# Patient Record
Sex: Male | Born: 1965
Health system: Southern US, Community
[De-identification: ages and names within clinical notes are randomized; demographics above are authoritative.]

## PROBLEM LIST (undated history)

## (undated) DIAGNOSIS — Z87820 Personal history of traumatic brain injury: Secondary | ICD-10-CM

## (undated) DIAGNOSIS — R569 Unspecified convulsions: Secondary | ICD-10-CM

## (undated) DIAGNOSIS — M1990 Unspecified osteoarthritis, unspecified site: Secondary | ICD-10-CM

## (undated) DIAGNOSIS — I10 Essential (primary) hypertension: Secondary | ICD-10-CM

## (undated) DIAGNOSIS — H3552 Pigmentary retinal dystrophy: Secondary | ICD-10-CM

## (undated) DIAGNOSIS — F419 Anxiety disorder, unspecified: Secondary | ICD-10-CM

## (undated) DIAGNOSIS — H269 Unspecified cataract: Secondary | ICD-10-CM

## (undated) DIAGNOSIS — K219 Gastro-esophageal reflux disease without esophagitis: Secondary | ICD-10-CM

## (undated) DIAGNOSIS — Z973 Presence of spectacles and contact lenses: Secondary | ICD-10-CM

## (undated) DIAGNOSIS — J45909 Unspecified asthma, uncomplicated: Secondary | ICD-10-CM

## (undated) DIAGNOSIS — Z87898 Personal history of other specified conditions: Secondary | ICD-10-CM

## (undated) DIAGNOSIS — N4340 Spermatocele of epididymis, unspecified: Secondary | ICD-10-CM

## (undated) HISTORY — DX: Unspecified asthma, uncomplicated: J45.909

## (undated) HISTORY — DX: Unspecified cataract: H26.9

## (undated) HISTORY — DX: Gastro-esophageal reflux disease without esophagitis: K21.9

## (undated) HISTORY — PX: COLONOSCOPY: SHX174

## (undated) HISTORY — PX: INGUINAL HERNIA REPAIR: SUR1180

## (undated) HISTORY — DX: Pigmentary retinal dystrophy: H35.52

---

## 2000-11-20 ENCOUNTER — Emergency Department (HOSPITAL_COMMUNITY): Admission: EM | Admit: 2000-11-20 | Discharge: 2000-11-20 | Payer: Self-pay | Admitting: *Deleted

## 2001-10-15 ENCOUNTER — Encounter: Payer: Self-pay | Admitting: Emergency Medicine

## 2001-10-15 ENCOUNTER — Emergency Department (HOSPITAL_COMMUNITY): Admission: EM | Admit: 2001-10-15 | Discharge: 2001-10-15 | Payer: Self-pay | Admitting: Emergency Medicine

## 2003-10-27 ENCOUNTER — Emergency Department (HOSPITAL_COMMUNITY): Admission: EM | Admit: 2003-10-27 | Discharge: 2003-10-27 | Payer: Self-pay | Admitting: Emergency Medicine

## 2010-12-02 ENCOUNTER — Emergency Department (HOSPITAL_COMMUNITY)
Admission: EM | Admit: 2010-12-02 | Discharge: 2010-12-02 | Disposition: A | Discharge status: Home / Self Care | Payer: Self-pay | Attending: Emergency Medicine | Admitting: Emergency Medicine

## 2010-12-02 ENCOUNTER — Emergency Department (HOSPITAL_COMMUNITY): Payer: Self-pay

## 2010-12-02 DIAGNOSIS — R002 Palpitations: Secondary | ICD-10-CM | POA: Insufficient documentation

## 2010-12-02 DIAGNOSIS — IMO0001 Reserved for inherently not codable concepts without codable children: Secondary | ICD-10-CM | POA: Insufficient documentation

## 2010-12-02 DIAGNOSIS — R079 Chest pain, unspecified: Secondary | ICD-10-CM | POA: Insufficient documentation

## 2010-12-02 DIAGNOSIS — R059 Cough, unspecified: Secondary | ICD-10-CM | POA: Insufficient documentation

## 2010-12-02 DIAGNOSIS — R000 Tachycardia, unspecified: Secondary | ICD-10-CM | POA: Insufficient documentation

## 2010-12-02 DIAGNOSIS — R110 Nausea: Secondary | ICD-10-CM | POA: Insufficient documentation

## 2010-12-02 DIAGNOSIS — R51 Headache: Secondary | ICD-10-CM | POA: Insufficient documentation

## 2010-12-02 DIAGNOSIS — R05 Cough: Secondary | ICD-10-CM | POA: Insufficient documentation

## 2010-12-02 DIAGNOSIS — R0602 Shortness of breath: Secondary | ICD-10-CM | POA: Insufficient documentation

## 2010-12-02 DIAGNOSIS — R109 Unspecified abdominal pain: Secondary | ICD-10-CM | POA: Insufficient documentation

## 2010-12-02 LAB — CBC
HCT: 41.3 % (ref 39.0–52.0)
Hemoglobin: 13.5 g/dL (ref 13.0–17.0)
MCH: 23.4 pg — ABNORMAL LOW (ref 26.0–34.0)
MCHC: 32.7 g/dL (ref 30.0–36.0)
MCV: 71.7 fL — ABNORMAL LOW (ref 78.0–100.0)
Platelets: 155 10*3/uL (ref 150–400)
RBC: 5.76 MIL/uL (ref 4.22–5.81)
RDW: 16.6 % — ABNORMAL HIGH (ref 11.5–15.5)
WBC: 8 10*3/uL (ref 4.0–10.5)

## 2010-12-02 LAB — URINALYSIS, ROUTINE W REFLEX MICROSCOPIC
Bilirubin Urine: NEGATIVE
Glucose, UA: NEGATIVE mg/dL
Hgb urine dipstick: NEGATIVE
Ketones, ur: NEGATIVE mg/dL
Nitrite: NEGATIVE
Protein, ur: NEGATIVE mg/dL
Specific Gravity, Urine: 1.02 (ref 1.005–1.030)
Urobilinogen, UA: 0.2 mg/dL (ref 0.0–1.0)
pH: 5.5 (ref 5.0–8.0)

## 2010-12-02 LAB — COMPREHENSIVE METABOLIC PANEL
ALT: 22 U/L (ref 0–53)
AST: 29 U/L (ref 0–37)
Albumin: 3.7 g/dL (ref 3.5–5.2)
Alkaline Phosphatase: 104 U/L (ref 39–117)
BUN: 11 mg/dL (ref 6–23)
CO2: 25 mEq/L (ref 19–32)
Calcium: 9.3 mg/dL (ref 8.4–10.5)
Chloride: 99 mEq/L (ref 96–112)
Creatinine, Ser: 0.88 mg/dL (ref 0.4–1.5)
GFR calc Af Amer: >60 mL/min (ref >60)
GFR calc non Af Amer: >60 mL/min (ref >60)
Glucose, Bld: 95 mg/dL (ref 70–99)
Potassium: 3.9 mEq/L (ref 3.5–5.1)
Sodium: 134 mEq/L — ABNORMAL LOW (ref 135–145)
Total Bilirubin: 0.6 mg/dL (ref 0.3–1.2)
Total Protein: 7.2 g/dL (ref 6.0–8.3)

## 2010-12-02 LAB — DIFFERENTIAL
Basophils Absolute: 0 10*3/uL (ref 0.0–0.1)
Basophils Relative: 0 % (ref 0–1)
Eosinophils Absolute: 0.2 10*3/uL (ref 0.0–0.7)
Eosinophils Relative: 3 % (ref 0–5)
Lymphocytes Relative: 32 % (ref 12–46)
Lymphs Abs: 2.5 10*3/uL (ref 0.7–4.0)
Monocytes Absolute: 1.1 10*3/uL — ABNORMAL HIGH (ref 0.1–1.0)
Monocytes Relative: 14 % — ABNORMAL HIGH (ref 3–12)
Neutro Abs: 4.1 10*3/uL (ref 1.7–7.7)
Neutrophils Relative %: 52 % (ref 43–77)

## 2011-07-27 ENCOUNTER — Emergency Department (HOSPITAL_COMMUNITY): Payer: Self-pay

## 2011-07-27 ENCOUNTER — Encounter (HOSPITAL_COMMUNITY): Payer: Self-pay | Admitting: Radiology

## 2011-07-27 ENCOUNTER — Emergency Department (HOSPITAL_COMMUNITY)
Admission: EM | Admit: 2011-07-27 | Discharge: 2011-07-27 | Disposition: A | Discharge status: Home / Self Care | Payer: Self-pay | Attending: Emergency Medicine | Admitting: Emergency Medicine

## 2011-07-27 DIAGNOSIS — I10 Essential (primary) hypertension: Secondary | ICD-10-CM | POA: Insufficient documentation

## 2011-07-27 DIAGNOSIS — R51 Headache: Secondary | ICD-10-CM | POA: Insufficient documentation

## 2011-07-27 DIAGNOSIS — J3489 Other specified disorders of nose and nasal sinuses: Secondary | ICD-10-CM | POA: Insufficient documentation

## 2011-07-27 HISTORY — DX: Unspecified convulsions: R56.9

## 2011-07-27 MED ORDER — HYDROCODONE-ACETAMINOPHEN 5-500 MG PO TABS: 1.0000 | ORAL_TABLET | Freq: Four times a day (QID) | ORAL | Status: AC | PRN

## 2011-07-27 MED ORDER — AMLODIPINE BESYLATE 5 MG PO TABS: 5.0000 mg | ORAL_TABLET | Freq: Every day | ORAL | Status: DC

## 2011-07-27 MED ORDER — HYDROCODONE-ACETAMINOPHEN 5-325 MG PO TABS
2.0000 | ORAL_TABLET | Freq: Once | ORAL | Status: AC
  Administered 2011-07-27: 2 via ORAL
  Filled 2011-07-27: qty 2

## 2011-07-27 MED ORDER — CETIRIZINE-PSEUDOEPHEDRINE ER 5-120 MG PO TB12: 1.0000 | ORAL_TABLET | Freq: Two times a day (BID) | ORAL | Status: AC | PRN

## 2011-07-27 MED ORDER — OXYCODONE-ACETAMINOPHEN 5-325 MG PO TABS
1.0000 | ORAL_TABLET | Freq: Once | ORAL | Status: AC
  Administered 2011-07-27: 1 via ORAL
  Filled 2011-07-27: qty 1

## 2011-07-27 NOTE — ED Provider Notes (Addendum)
History     CSN: [id number]  Arrival date & time 07/27/11  1258   First MD Initiated Contact with Patient 07/27/11 1304      No chief complaint on file. chief complaint: headache  (Consider location/radiation/quality/duration/timing/severity/associated sxs/prior treatment) The history is provided by the patient.  pt c/o frontal headache for past 5-6 days. Gradual onset, constant. Dull. Non radiating. Slow worse. No acute or abrupt change since onset. Mild at onset. Notes similar headaches as child/teen but states this is longer in duration. No recent head injury, trauma, or fall. No neck pain or stiffness. No nv. No numbness/weakness. No eye pain or change in vision. No change in speech. No problems w balance or coordination. No change by time of day, activity, or position. Pt denies exacerbating or alleviating factors. States has had recent sinus and nasal congestion, rhinorrhea. Frontal sinus area pain as well. No sore throat, cough, fever, or other uri c/o.   No past medical history on file.  No past surgical history on file.  No family history on file.  History  Substance Use Topics  . Smoking status: Not on file  . Smokeless tobacco: Not on file  . Alcohol Use: Not on file      Review of Systems  Constitutional: Negative for fever and chills.  HENT: Negative for neck pain and neck stiffness.   Eyes: Negative for pain, redness and visual disturbance.  Respiratory: Negative for shortness of breath.   Cardiovascular: Negative for chest pain, palpitations and leg swelling.  Gastrointestinal: Negative for abdominal pain.  Genitourinary: Negative for flank pain.  Musculoskeletal: Negative for back pain.  Skin: Negative for rash.  Neurological: Positive for headaches. Negative for syncope, weakness and numbness.  Hematological: Does not bruise/bleed easily.  Psychiatric/Behavioral: Negative for confusion.    Allergies  Aspirin  Home Medications   Current Outpatient Rx   Name Route Sig Dispense Refill  . FEXOFENADINE HCL 180 MG PO TABS Oral Take 180 mg by mouth once.    Marland Kitchen ONDANSETRON HCL 4 MG PO TABS Oral Take 4 mg by mouth every 8 (eight) hours as needed. For nausea.    Marland Kitchen VICKS SINEX 12 HOUR NA Nasal Place 1 spray into the nose every 12 (twelve) hours. For nasal decongestant    . TETRAHYDROZOLINE HCL 0.05 % OP SOLN Both Eyes Place 1 drop into both eyes as needed.      BP 158/112  Pulse 72  Temp(Src) 98.3 F (36.8 C) (Oral)  Resp 20  SpO2 98%  Physical Exam  Nursing note and vitals reviewed. Constitutional: He is oriented to person, place, and time. He appears well-developed and well-nourished. No distress.  HENT:  Head: Atraumatic.  Mouth/Throat: Oropharynx is clear and moist.       Mild frontal sinus tenderness. No oral trauma.   Eyes: Conjunctivae and EOM are normal. Pupils are equal, round, and reactive to light.  Neck: Normal range of motion. Neck supple. No tracheal deviation present.       No stiffness or rigidity  Cardiovascular: Normal rate, regular rhythm, normal heart sounds and intact distal pulses.  Exam reveals no gallop and no friction rub.   No murmur heard. Pulmonary/Chest: Effort normal and breath sounds normal. No accessory muscle usage. No respiratory distress. He has no rales.  Abdominal: Soft. Bowel sounds are normal. He exhibits no distension and no mass. There is no tenderness. There is no rebound and no guarding.  Musculoskeletal: Normal range of motion. He exhibits no edema  and no tenderness.  Neurological: He is alert and oriented to person, place, and time.       No facial droop. Motor intact bil. No pronator drift. Steady gait.   Skin: Skin is warm and dry.  Psychiatric: He has a normal mood and affect.    ED Course  Procedures (including critical care time)   Ct Head Wo Contrast  07/27/2011  *RADIOLOGY REPORT*  Clinical Data: Headache for 5 days.  Possible seizure.  CT HEAD WITHOUT CONTRAST  Technique:   Contiguous axial images were obtained from the base of the skull through the vertex without contrast.  Comparison: None.  Findings: The brain appears normal without evidence of acute infarction, hemorrhage, mass lesion, mass effect, midline shift or abnormal extra-axial fluid collection.  No hydrocephalus or pneumocephalus.  Mucous retention cysts or polyps are seen in the right maxillary sinus and left sphenoid sinus.  IMPRESSION: No acute finding.  Original Report Authenticated By: Bernadene Bell. D'ALESSIO, M.D.      MDM  vicodin po, ct.  Ct neg acute.   recheck pt comfortable. Neuro exam unremarkable. No neck stiffness/rigidity.   Pt notes hx htn, noted during prior ed visit to be hypertensive, will give rx htn, also will give rx re sinus symptoms.     Discussed importance of close pcp f/u .       Suzi Roots, MD 07/27/11 1600  Suzi Roots, MD 07/27/11 [phone]

## 2011-07-27 NOTE — ED Notes (Signed)
Patient changed self into gown. Darren Larsen is backwards but patient prefers this. Patient is connected to continues BP, Pulse Ox and telemetry.

## 2011-07-27 NOTE — ED Notes (Signed)
Patient transported to X-ray 

## 2011-07-27 NOTE — ED Notes (Addendum)
Pt states that he has had bad headaches x5 days and that when he woke up this morning the pain was much worse.  He states that he has a hx of seizures 20 years ago.  He is not on any meds for seizures.  He has also been vomiting for the 2 days.  Last emesis was around 12:45.  Pain is 10/10.

## 2012-04-05 ENCOUNTER — Encounter (HOSPITAL_COMMUNITY): Payer: Self-pay | Admitting: Emergency Medicine

## 2012-04-05 ENCOUNTER — Emergency Department (HOSPITAL_COMMUNITY)
Admission: EM | Admit: 2012-04-05 | Discharge: 2012-04-05 | Disposition: A | Discharge status: Home / Self Care | Payer: Self-pay | Attending: Emergency Medicine | Admitting: Emergency Medicine

## 2012-04-05 ENCOUNTER — Emergency Department (HOSPITAL_COMMUNITY): Payer: Self-pay

## 2012-04-05 DIAGNOSIS — M25562 Pain in left knee: Secondary | ICD-10-CM

## 2012-04-05 DIAGNOSIS — Z886 Allergy status to analgesic agent status: Secondary | ICD-10-CM | POA: Insufficient documentation

## 2012-04-05 DIAGNOSIS — M25569 Pain in unspecified knee: Secondary | ICD-10-CM | POA: Insufficient documentation

## 2012-04-05 DIAGNOSIS — X500XXA Overexertion from strenuous movement or load, initial encounter: Secondary | ICD-10-CM | POA: Insufficient documentation

## 2012-04-05 DIAGNOSIS — F17200 Nicotine dependence, unspecified, uncomplicated: Secondary | ICD-10-CM | POA: Insufficient documentation

## 2012-04-05 HISTORY — DX: Essential (primary) hypertension: I10

## 2012-04-05 MED ORDER — OXYCODONE-ACETAMINOPHEN 5-325 MG PO TABS
2.0000 | ORAL_TABLET | Freq: Once | ORAL | Status: AC
  Administered 2012-04-05: 2 via ORAL
  Filled 2012-04-05: qty 2

## 2012-04-05 MED ORDER — CYCLOBENZAPRINE HCL 10 MG PO TABS: 10.0000 mg | ORAL_TABLET | Freq: Two times a day (BID) | ORAL | Status: DC | PRN

## 2012-04-05 MED ORDER — OXYCODONE-ACETAMINOPHEN 5-325 MG PO TABS: 1.0000 | ORAL_TABLET | Freq: Four times a day (QID) | ORAL | Status: DC | PRN

## 2012-04-05 MED ORDER — AMLODIPINE BESYLATE 10 MG PO TABS: 5.0000 mg | ORAL_TABLET | Freq: Every day | ORAL | Status: DC

## 2012-04-05 MED ORDER — IBUPROFEN 800 MG PO TABS: 800.0000 mg | ORAL_TABLET | Freq: Three times a day (TID) | ORAL | Status: DC

## 2012-04-05 NOTE — ED Provider Notes (Signed)
History     CSN: [id number]  Arrival date & time 04/05/12  1308   First MD Initiated Contact with Patient 04/05/12 1324      Chief Complaint  Patient presents with  . Knee Pain    (Consider location/radiation/quality/duration/timing/severity/associated sxs/prior treatment) HPI Comments: Darren Larsen 46 y.o. male   The chief complaint is: Patient presents with:   Knee Pain   The patient has medical history significant for:   Past Medical History:   Seizures                                                     Hypertension                                                Patient presents with left knee pain x 1 month. Patient states that be believes that he may have hurt it while kickboxing. Patient states that the knee does not hurt when extended. He tried a knee wrap but thinks it made the pain worse. Of note, patient does hard manual labor, building walls from heavy blocks. Denies fever or chills. Denies NVD or abdominal pain. Reports some gait change with a mild limp.     The history is provided by the patient. No language interpreter was used.    Past Medical History  Diagnosis Date  . Seizures   . Hypertension     Past Surgical History  Procedure Date  . Hernia repair     History reviewed. No pertinent family history.  History  Substance Use Topics  . Smoking status: Current Every Day Smoker -- 0.5 packs/day    Types: Cigarettes  . Smokeless tobacco: Not on file  . Alcohol Use: Yes     Occ      Review of Systems  Constitutional: Negative for fever and chills.  Gastrointestinal: Negative for nausea, vomiting, abdominal pain and diarrhea.  Musculoskeletal: Positive for arthralgias and gait problem.  All other systems reviewed and are negative.    Allergies  Aspirin  Home Medications   Current Outpatient Rx  Name Route Sig Dispense Refill  . TETRAHYDROZOLINE HCL 0.05 % OP SOLN Both Eyes Place 1 drop into both eyes as needed. For redness    .  AMLODIPINE BESYLATE 5 MG PO TABS Oral Take 1 tablet (5 mg total) by mouth daily. 30 tablet 0  . CETIRIZINE-PSEUDOEPHEDRINE ER 5-120 MG PO TB12 Oral Take 1 tablet by mouth 2 (two) times daily as needed for allergies. 14 tablet 0  . ONDANSETRON HCL 4 MG PO TABS Oral Take 4 mg by mouth every 8 (eight) hours as needed. For nausea.    Marland Kitchen VICKS SINEX 12 HOUR NA Nasal Place 1 spray into the nose every 12 (twelve) hours. For nasal decongestant      BP 120/94  Pulse 100  Temp 99.4 F (37.4 C) (Oral)  Resp 20  SpO2 98%  Physical Exam  Nursing note and vitals reviewed. Constitutional: He appears well-developed and well-nourished.  HENT:  Head: Normocephalic and atraumatic.  Mouth/Throat: Oropharynx is clear and moist.  Eyes: Conjunctivae normal and EOM are normal.  Neck: Normal range of motion. Neck supple.  Cardiovascular: Regular rhythm and  normal heart sounds.   Pulmonary/Chest: Effort normal and breath sounds normal.  Abdominal: Soft. Bowel sounds are normal. There is no tenderness.  Musculoskeletal: Normal range of motion. He exhibits edema and tenderness.       Left knee is slightly larger than right. Patient has tenderness with flexion. No erythema or increased warmth appreciated.  Neurological: He is alert.  Skin: Skin is warm and dry.    ED Course  Procedures (including critical care time)  Labs Reviewed - No data to display No results found.   1. Knee pain, left       MDM  Patient presented with left knee pain X 1 month. Patient given pain medication with improvement. Patient discharged on pain medication and antiinflammatory. Patient also given crutches and knee sleeve, which he states is better than the one he has at home. Instructed to follow-up with ortho on call in a few days if symptoms do not improve. No red flags for septic arthritis. Return precautions given verbally and in discharge summary.        Pixie Casino, PA-C 04/05/12 1530

## 2012-04-05 NOTE — ED Notes (Signed)
Pt states he feels pain and pressure on his left knee pain for about a month. He did not seek care because of cost.

## 2012-04-05 NOTE — ED Provider Notes (Signed)
Medical screening examination/treatment/procedure(s) were performed by non-physician practitioner and as supervising physician I was immediately available for consultation/collaboration.   Lyanne Co, MD 04/05/12 [phone]

## 2012-04-05 NOTE — ED Notes (Signed)
Ortho tech called 

## 2012-10-03 ENCOUNTER — Encounter (HOSPITAL_COMMUNITY): Payer: Self-pay | Admitting: Emergency Medicine

## 2012-10-03 ENCOUNTER — Emergency Department (HOSPITAL_COMMUNITY)
Admission: EM | Admit: 2012-10-03 | Discharge: 2012-10-03 | Disposition: A | Discharge status: Home / Self Care | Payer: Self-pay | Attending: Emergency Medicine | Admitting: Emergency Medicine

## 2012-10-03 DIAGNOSIS — F17200 Nicotine dependence, unspecified, uncomplicated: Secondary | ICD-10-CM | POA: Insufficient documentation

## 2012-10-03 DIAGNOSIS — I10 Essential (primary) hypertension: Secondary | ICD-10-CM | POA: Insufficient documentation

## 2012-10-03 DIAGNOSIS — Z8669 Personal history of other diseases of the nervous system and sense organs: Secondary | ICD-10-CM | POA: Insufficient documentation

## 2012-10-03 DIAGNOSIS — K047 Periapical abscess without sinus: Secondary | ICD-10-CM | POA: Insufficient documentation

## 2012-10-03 DIAGNOSIS — Z79899 Other long term (current) drug therapy: Secondary | ICD-10-CM | POA: Insufficient documentation

## 2012-10-03 DIAGNOSIS — H9209 Otalgia, unspecified ear: Secondary | ICD-10-CM | POA: Insufficient documentation

## 2012-10-03 MED ORDER — OXYCODONE-ACETAMINOPHEN 5-325 MG PO TABS: 1.0000 | ORAL_TABLET | ORAL | Status: DC | PRN

## 2012-10-03 MED ORDER — PENICILLIN V POTASSIUM 500 MG PO TABS: 250.0000 mg | ORAL_TABLET | Freq: Four times a day (QID) | ORAL | Status: AC

## 2012-10-03 MED ORDER — OXYCODONE-ACETAMINOPHEN 5-325 MG PO TABS
1.0000 | ORAL_TABLET | Freq: Once | ORAL | Status: AC
  Administered 2012-10-03: 1 via ORAL
  Filled 2012-10-03: qty 1

## 2012-10-03 NOTE — ED Provider Notes (Signed)
History     CSN: [id number]  Arrival date & time 10/03/12  1355   First MD Initiated Contact with Patient 10/03/12 1421      Chief Complaint  Patient presents with  . Dental Pain  . Otalgia    (Consider location/radiation/quality/duration/timing/severity/associated sxs/prior treatment) HPI  Patient is a 47 yo M presenting with right sided dental abscess x 1 week. Patient "popped" the abscess last night intentionally with unspecified drainage from site. Pain is radiating to right side of face to right ear. 8/10 constant sharp pain. Eating, drinking, and talking aggravating pain. No alleviating factors. Denies fevers, chills, voice changes, trismus, difficulty swallowing, SOB, or CP. Prior history of trauma to the tooth over dental abscess site.   Past Medical History  Diagnosis Date  . Seizures   . Hypertension     Past Surgical History  Procedure Laterality Date  . Hernia repair      No family history on file.  History  Substance Use Topics  . Smoking status: Current Every Day Smoker -- 0.50 packs/day    Types: Cigarettes  . Smokeless tobacco: Not on file  . Alcohol Use: Yes     Comment: Occ      Review of Systems  Constitutional: Negative for fever and chills.  HENT: Positive for ear pain and dental problem. Negative for congestion, sore throat, rhinorrhea, trouble swallowing and neck pain.   Eyes: Negative for visual disturbance.  Respiratory: Negative for cough and shortness of breath.   Cardiovascular: Negative for chest pain.  Gastrointestinal: Negative for abdominal pain.  Neurological: Negative for dizziness, light-headedness and headaches.    Allergies  Aspirin  Home Medications   Current Outpatient Rx  Name  Route  Sig  Dispense  Refill  . amLODipine (NORVASC) 10 MG tablet   Oral   Take 0.5 tablets (5 mg total) by mouth daily.   30 tablet   3   . ibuprofen (ADVIL,MOTRIN) 800 MG tablet   Oral   Take 1 tablet (800 mg total) by mouth 3  (three) times daily.   21 tablet   0   . Oxymetazoline HCl (VICKS SINEX 12 HOUR NA)   Nasal   Place 1 spray into the nose every 12 (twelve) hours. For nasal decongestant         . tetrahydrozoline 0.05 % ophthalmic solution   Both Eyes   Place 1 drop into both eyes as needed. For redness         . EXPIRED: amLODipine (NORVASC) 5 MG tablet   Oral   Take 1 tablet (5 mg total) by mouth daily.   30 tablet   0     BP 134/92  Temp(Src) 98.8 F (37.1 C) (Oral)  Resp 16  SpO2 100%  Physical Exam  Constitutional: He is oriented to person, place, and time. He appears well-developed and well-nourished.  HENT:  Head: Normocephalic and atraumatic.  Right Ear: Tympanic membrane normal.  Left Ear: Tympanic membrane normal.  Mouth/Throat: Uvula is midline and mucous membranes are normal. Abnormal dentition. Dental abscesses and dental caries present. No oropharyngeal exudate, posterior oropharyngeal edema, posterior oropharyngeal erythema or tonsillar abscesses.    Dime sized fluctuant erythematous tender to palpitation abscess over second to last top right molar. No drainage, bleeding, or swelling. Tooth fracture second to last top left molar.  Eyes: Conjunctivae are normal.  Neck: Trachea normal and normal range of motion. Neck supple. No tracheal deviation present.    Cardiovascular: Normal rate, regular  rhythm and normal heart sounds.   Pulmonary/Chest: Effort normal and breath sounds normal.  Neurological: He is alert and oriented to person, place, and time.    ED Course  Procedures (including critical care time)  Labs Reviewed - No data to display No results found.   1. Dental abscess       MDM  Patient is a 47 yo M presenting with weeklong dental pain. Patient has evidence of dental abscess. Patient had no signs of peritonsillar abscess. Patient was referred to dentistry for further treatment for the dental abscess and tooth fracture. Patient was advised he needed  immediate follow up with the dentist to get the abscess taken care of. Patient understood the complications that could arise if waiting too long. Patient was agreeable to the plan to follow up with dentistry. Patient was given pain medication to manage pain (advised not to drive on the medication) and antibiotics for the abscess. Vitals were stable at time of discharge. Return precautions were given the patient. Patient in agreement with plan to follow up and when to return to ED. Patient is stable at time of discharge         Jeannetta Ellis, PA-C 10/03/12 1736

## 2012-10-03 NOTE — ED Notes (Signed)
Pt states he has had an abscessed tooth on rt side for over a week.  Last night he states that he "busted it".  Pain 8/10.  Also c/o ear pain on rt side.

## 2012-10-05 NOTE — ED Provider Notes (Signed)
Medical screening examination/treatment/procedure(s) were performed by non-physician practitioner and as supervising physician I was immediately available for consultation/collaboration.   Laray Anger, DO 10/05/12 1923

## 2014-09-23 ENCOUNTER — Emergency Department (HOSPITAL_COMMUNITY)
Admission: EM | Admit: 2014-09-23 | Discharge: 2014-09-23 | Disposition: A | Discharge status: Home / Self Care | Payer: Self-pay | Attending: Emergency Medicine | Admitting: Emergency Medicine

## 2014-09-23 ENCOUNTER — Encounter (HOSPITAL_COMMUNITY): Payer: Self-pay

## 2014-09-23 DIAGNOSIS — G629 Polyneuropathy, unspecified: Secondary | ICD-10-CM | POA: Insufficient documentation

## 2014-09-23 DIAGNOSIS — Z720 Tobacco use: Secondary | ICD-10-CM | POA: Insufficient documentation

## 2014-09-23 DIAGNOSIS — I10 Essential (primary) hypertension: Secondary | ICD-10-CM | POA: Insufficient documentation

## 2014-09-23 DIAGNOSIS — Z79899 Other long term (current) drug therapy: Secondary | ICD-10-CM | POA: Insufficient documentation

## 2014-09-23 LAB — BASIC METABOLIC PANEL
ANION GAP: 5 (ref 5–15)
BUN: 13 mg/dL (ref 6–23)
CHLORIDE: 107 mmol/L (ref 96–112)
CO2: 28 mmol/L (ref 19–32)
Calcium: 9.6 mg/dL (ref 8.4–10.5)
Creatinine, Ser: 0.93 mg/dL (ref 0.50–1.35)
Glucose, Bld: 91 mg/dL (ref 70–99)
POTASSIUM: 4.2 mmol/L (ref 3.5–5.1)
SODIUM: 140 mmol/L (ref 135–145)

## 2014-09-23 LAB — CBC
HEMATOCRIT: 40.7 % (ref 39.0–52.0)
Hemoglobin: 13.5 g/dL (ref 13.0–17.0)
MCH: 24 pg — ABNORMAL LOW (ref 26.0–34.0)
MCHC: 33.2 g/dL (ref 30.0–36.0)
MCV: 72.3 fL — ABNORMAL LOW (ref 78.0–100.0)
PLATELETS: 207 10*3/uL (ref 150–400)
RBC: 5.63 MIL/uL (ref 4.22–5.81)
RDW: 16.1 % — AB (ref 11.5–15.5)
WBC: 8.6 10*3/uL (ref 4.0–10.5)

## 2014-09-23 MED ORDER — VITAMIN B-1 100 MG PO TABS: 100.0000 mg | ORAL_TABLET | Freq: Every day | ORAL | Status: DC

## 2014-09-23 MED ORDER — AMLODIPINE BESYLATE 10 MG PO TABS: 5.0000 mg | ORAL_TABLET | Freq: Every day | ORAL | Status: DC

## 2014-09-23 MED ORDER — ONE-A-DAY MENS PO TABS: 1.0000 | ORAL_TABLET | Freq: Every day | ORAL | Status: DC

## 2014-09-23 MED ORDER — HYDROCODONE-ACETAMINOPHEN 5-325 MG PO TABS: 1.0000 | ORAL_TABLET | ORAL | Status: DC | PRN

## 2014-09-23 NOTE — Progress Notes (Signed)
  CARE MANAGEMENT ED NOTE 09/23/2014  Patient:  Darren Larsen, Darren Larsen   Account Number:  [id number]  Date Initiated:  09/23/2014  Documentation initiated by:  Livia Snellen  Subjective/Objective Assessment:   patient presents to Ed with increasing bilateral shoulder, bilateral hand, and L leg numbness and L leg "buring" x 1 year     Subjective/Objective Assessment Detail:     Action/Plan:   Action/Plan Detail:   Anticipated DC Date:  09/23/2014     Status Recommendation to Physician:   Result of Recommendation:    Other ED [place]  Other  PCP issues    Choice offered to / List presented to:            Status of service:  Completed, signed off  ED Comments:   ED Comments Detail:  EDCM spoke to patient at bedside. Patient confirms he does not have a pcp or insurance living in [place]. EDCM provide patient with pamphlet to [facility], informed patient of services there and walk in times.  EDCM also provided patient with list of pcps who accept self pay patients, list of discount pharmacies and websites needymeds.org and GoodRX.com for medication assistance, phone number to inquire about the orange card, phone number to inquire about [employer], phone number to inquire about the [place], financial resources in the community such as local churches, [employer], [employer]ries, and dental assistance for uninsured patients. Patient thankful for resources.  No further EDCM needs at this time.

## 2014-09-23 NOTE — ED Notes (Signed)
Pt c/o increasing bilateral shoulder, bilateral hand, and L leg numbness and L leg "buring" x 1 year.  Pain score 8/10.  Pt reports taking OTC medication w/o relief.  Sts numbness is worse in the morning.

## 2014-09-23 NOTE — Discharge Instructions (Signed)
Neuropathic Pain We often think that pain has a physical cause. If we get rid of the cause, the pain should go away. Nerves themselves can also cause pain. It is called neuropathic pain, which means nerve abnormality. It may be difficult for the patients who have it and for the treating caregivers. Pain is usually described as acute (short-lived) or chronic (long-lasting). Acute pain is related to the physical sensations caused by an injury. It can last from a few seconds to many weeks, but it usually goes away when normal healing occurs. Chronic pain lasts beyond the typical healing time. With neuropathic pain, the nerve fibers themselves may be damaged or injured. They then send incorrect signals to other pain centers. The pain you feel is real, but the cause is not easy to find.  CAUSES  Chronic pain can result from diseases, such as diabetes and shingles (an infection related to chickenpox), or from trauma, surgery, or amputation. It can also happen without any known injury or disease. The nerves are sending pain messages, even though there is no identifiable cause for such messages.   Other common causes of neuropathy include diabetes, phantom limb pain, or Regional Pain Syndrome (RPS).  As with all forms of chronic back pain, if neuropathy is not correctly treated, there can be a number of associated problems that lead to a downward cycle for the patient. These include depression, sleeplessness, feelings of fear and anxiety, limited social interaction and inability to do normal daily activities or work.  The most dramatic and mysterious example of neuropathic pain is called "phantom limb syndrome." This occurs when an arm or a leg has been removed because of illness or injury. The brain still gets pain messages from the nerves that originally carried impulses from the missing limb. These nerves now seem to misfire and cause troubling pain.  Neuropathic pain often seems to have no cause. It responds  poorly to standard pain treatment. Neuropathic pain can occur after:  Shingles (herpes zoster virus infection).  A lasting burning sensation of the skin, caused usually by injury to a peripheral nerve.  Peripheral neuropathy which is widespread nerve damage, often caused by diabetes or alcoholism.  Phantom limb pain following an amputation.  Facial nerve problems (trigeminal neuralgia).  Multiple sclerosis.  Reflex sympathetic dystrophy.  Pain which comes with cancer and cancer chemotherapy.  Entrapment neuropathy such as when pressure is put on a nerve such as in carpal tunnel syndrome.  Back, leg, and hip problems (sciatica).  Spine or back surgery.  HIV Infection or AIDS where nerves are infected by viruses. Your caregiver can explain items in the above list which may apply to you. SYMPTOMS  Characteristics of neuropathic pain are:  Severe, sharp, electric shock-like, shooting, lightening-like, knife-like.  Pins and needles sensation.  Deep burning, deep cold, or deep ache.  Persistent numbness, tingling, or weakness.  Pain resulting from light touch or other stimulus that would not usually cause pain.  Increased sensitivity to something that would normally cause pain, such as a pinprick. Pain may persist for months or years following the healing of damaged tissues. When this happens, pain signals no longer sound an alarm about current injuries or injuries about to happen. Instead, the alarm system itself is not working correctly.  Neuropathic pain may get worse instead of better over time. For some people, it can lead to serious disability. It is important to be aware that severe injury in a limb can occur without a proper, protective pain  response.Burns, cuts, and other injuries may go unnoticed. Without proper treatment, these injuries can become infected or lead to further disability. Take any injury seriously, and consult your caregiver for treatment. DIAGNOSIS    When you have a pain with no known cause, your caregiver will probably ask some specific questions:   Do you have any other conditions, such as diabetes, shingles, multiple sclerosis, or HIV infection?  How would you describe your pain? (Neuropathic pain is often described as shooting, stabbing, burning, or searing.)  Is your pain worse at any time of the day? (Neuropathic pain is usually worse at night.)  Does the pain seem to follow a certain physical pathway?  Does the pain come from an area that has missing or injured nerves? (An example would be phantom limb pain.)  Is the pain triggered by minor things such as rubbing against the sheets at night? These questions often help define the type of pain involved. Once your caregiver knows what is happening, treatment can begin. Anticonvulsant, antidepressant drugs, and various pain relievers seem to work in some cases. If another condition, such as diabetes is involved, better management of that disorder may relieve the neuropathic pain.  TREATMENT  Neuropathic pain is frequently long-lasting and tends not to respond to treatment with narcotic type pain medication. It may respond well to other drugs such as antiseizure and antidepressant medications. Usually, neuropathic problems do not completely go away, but partial improvement is often possible with proper treatment. Your caregivers have large numbers of medications available to treat you. Do not be discouraged if you do not get immediate relief. Sometimes different medications or a combination of medications will be tried before you receive the results you are hoping for. See your caregiver if you have pain that seems to be coming from nowhere and does not go away. Help is available.  SEEK IMMEDIATE MEDICAL CARE IF:   There is a sudden change in the quality of your pain, especially if the change is on only one side of the body.  You notice changes of the skin, such as redness, black or  purple discoloration, swelling, or an ulcer.  You cannot move the affected limbs. Document Released: 03/24/2004 Document Revised: 09/19/2011 Document Reviewed: 03/24/2004 [facility] Patient Information 2015 [place], [place]. This information is not intended to replace advice given to you by your health care provider. Make sure you discuss any questions you have with your health care provider. Hypertension Hypertension, commonly called high blood pressure, is when the force of blood pumping through your arteries is too strong. Your arteries are the blood vessels that carry blood from your heart throughout your body. A blood pressure reading consists of a higher number over a lower number, such as 110/72. The higher number (systolic) is the pressure inside your arteries when your heart pumps. The lower number (diastolic) is the pressure inside your arteries when your heart relaxes. Ideally you want your blood pressure below 120/80. Hypertension forces your heart to work harder to pump blood. Your arteries may become narrow or stiff. Having hypertension puts you at risk for heart disease, stroke, and other problems.  RISK FACTORS Some risk factors for high blood pressure are controllable. Others are not.  Risk factors you cannot control include:   Race. You may be at higher risk if you are African American.  Age. Risk increases with age.  Gender. Men are at higher risk than women before age 53 years. After age 85, women are at higher risk than  men. Risk factors you can control include:  Not getting enough exercise or physical activity.  Being overweight.  Getting too much fat, sugar, calories, or salt in your diet.  Drinking too much alcohol. SIGNS AND SYMPTOMS Hypertension does not usually cause signs or symptoms. Extremely high blood pressure (hypertensive crisis) may cause headache, anxiety, shortness of breath, and nosebleed. DIAGNOSIS  To check if you have hypertension, your health care  provider will measure your blood pressure while you are seated, with your arm held at the level of your heart. It should be measured at least twice using the same arm. Certain conditions can cause a difference in blood pressure between your right and left arms. A blood pressure reading that is higher than normal on one occasion does not mean that you need treatment. If one blood pressure reading is high, ask your health care provider about having it checked again. TREATMENT  Treating high blood pressure includes making lifestyle changes and possibly taking medicine. Living a healthy lifestyle can help lower high blood pressure. You may need to change some of your habits. Lifestyle changes may include:  Following the DASH diet. This diet is high in fruits, vegetables, and whole grains. It is low in salt, red meat, and added sugars.  Getting at least 2 hours of brisk physical activity every week.  Losing weight if necessary.  Not smoking.  Limiting alcoholic beverages.  Learning ways to reduce stress. If lifestyle changes are not enough to get your blood pressure under control, your health care provider may prescribe medicine. You may need to take more than one. Work closely with your health care provider to understand the risks and benefits. HOME CARE INSTRUCTIONS  Have your blood pressure rechecked as directed by your health care provider.   Take medicines only as directed by your health care provider. Follow the directions carefully. Blood pressure medicines must be taken as prescribed. The medicine does not work as well when you skip doses. Skipping doses also puts you at risk for problems.   Do not smoke.   Monitor your blood pressure at home as directed by your health care provider. SEEK MEDICAL CARE IF:   You think you are having a reaction to medicines taken.  You have recurrent headaches or feel dizzy.  You have swelling in your ankles.  You have trouble with your  vision. SEEK IMMEDIATE MEDICAL CARE IF:  You develop a severe headache or confusion.  You have unusual weakness, numbness, or feel faint.  You have severe chest or abdominal pain.  You vomit repeatedly.  You have trouble breathing. MAKE SURE YOU:   Understand these instructions.  Will watch your condition.  Will get help right away if you are not doing well or get worse. Document Released: 06/27/2005 Document Revised: 11/11/2013 Document Reviewed: 04/19/2013 [facility] Patient Information 2015 [place], [place]. This information is not intended to replace advice given to you by your health care provider. Make sure you discuss any questions you have with your health care provider.

## 2014-09-23 NOTE — ED Notes (Signed)
MD at bedside. 

## 2014-09-23 NOTE — ED Provider Notes (Signed)
CSN: [id number]     Arrival date & time 09/23/14  1551 History   First MD Initiated Contact with Patient 09/23/14 1803     Chief Complaint  Patient presents with  . Numbness    HPI Pt has been having trouble with numbness in his extremities for the last year.  He feels a burning pain/numbness in both of his hands.  He does not feel like he sensation in his hands.  He also has numbness in his left leg from the thigh to the foot.  He has some in his right leg but not as bad. Past Medical History  Diagnosis Date  . Seizures   . Hypertension    Past Surgical History  Procedure Laterality Date  . Hernia repair     History reviewed. No pertinent family history. History  Substance Use Topics  . Smoking status: Current Every Day Smoker -- 0.50 packs/day    Types: Cigarettes  . Smokeless tobacco: Not on file  . Alcohol Use: Yes     Comment: Occ    Review of Systems  All other systems reviewed and are negative.     Allergies  Aspirin  Home Medications   Prior to Admission medications   Medication Sig Start Date End Date Taking? Authorizing Provider  ibuprofen (ADVIL,MOTRIN) 200 MG tablet Take 400 mg by mouth every 6 (six) hours as needed for headache or moderate pain.   Yes Historical Provider, MD  Omega-3 Fatty Acids (FISH OIL PO) Take 1 capsule by mouth 3 (three) times daily.   Yes Historical Provider, MD  tetrahydrozoline 0.05 % ophthalmic solution Place 1 drop into both eyes daily as needed (dry/ red eyes).    Yes Historical Provider, MD  amLODipine (NORVASC) 10 MG tablet Take 0.5 tablets (5 mg total) by mouth daily. 09/23/14   Dorie Rank, MD  HYDROcodone-acetaminophen (NORCO/VICODIN) 5-325 MG per tablet Take 1-2 tablets by mouth every 4 (four) hours as needed. 09/23/14   Dorie Rank, MD  multivitamin (ONE-A-DAY MEN'S) TABS tablet Take 1 tablet by mouth daily. 09/23/14   Dorie Rank, MD  thiamine (VITAMIN B-1) 100 MG tablet Take 1 tablet (100 mg total) by mouth daily. 09/23/14   Dorie Rank, MD   BP 154/106 mmHg  Pulse 90  Temp(Src) 98.2 F (36.8 C) (Oral)  Resp 16  SpO2 99% Physical Exam  Constitutional: He is oriented to person, place, and time. He appears well-developed and well-nourished. No distress.  HENT:  Head: Normocephalic and atraumatic.  Right Ear: External ear normal.  Left Ear: External ear normal.  Mouth/Throat: Oropharynx is clear and moist.  Eyes: Conjunctivae are normal. Right eye exhibits no discharge. Left eye exhibits no discharge. No scleral icterus.  Neck: Neck supple. No tracheal deviation present.  Cardiovascular: Normal rate, regular rhythm and intact distal pulses.   Pulmonary/Chest: Effort normal and breath sounds normal. No stridor. No respiratory distress. He has no wheezes. He has no rales.  Abdominal: Soft. Bowel sounds are normal. He exhibits no distension. There is no tenderness. There is no rebound and no guarding.  Musculoskeletal: He exhibits no edema or tenderness.  Neurological: He is alert and oriented to person, place, and time. He has normal strength. No cranial nerve deficit (No facial droop, extraocular movements intact, tongue midline ) or sensory deficit. He exhibits normal muscle tone. He displays no seizure activity. Coordination normal.  No pronator drift bilateral upper extrem, able to hold both legs off bed for 5 seconds, sensation intact in  all extremities, no visual field cuts, no left or right sided neglect, normal finger-nose exam bilaterally, no nystagmus noted   Skin: Skin is warm and dry. No rash noted.  Psychiatric: He has a normal mood and affect.  Nursing note and vitals reviewed.   ED Course  Procedures (including critical care time) Labs Review Labs Reviewed  CBC - Abnormal; Notable for the following:    MCV 72.3 (*)    MCH 24.0 (*)    RDW 16.1 (*)    All other components within normal limits  BASIC METABOLIC PANEL     MDM   Final diagnoses:  Neuropathy  Essential hypertension    Pt has  symptoms suggestive of neuropathic pain.  ?cervical radiculopathy would account for his arm symptoms but he also has symptoms in left leg.  To some extent it is in all extremities.  No focal weakness.  On my exam he can still feel me touch his extremities.  i doubt acute emergency issues, recommend follow up with neurology.  Also follow up with a PCP to review her blood pressure    Dorie Rank, MD 09/23/14 [phone]

## 2014-10-14 ENCOUNTER — Ambulatory Visit: Payer: Self-pay | Admitting: Family Medicine

## 2014-10-29 ENCOUNTER — Encounter: Payer: Self-pay | Admitting: Family Medicine

## 2014-10-29 ENCOUNTER — Telehealth: Payer: Self-pay | Admitting: Family Medicine

## 2014-10-29 ENCOUNTER — Ambulatory Visit: Payer: Self-pay | Attending: Family Medicine | Admitting: Family Medicine

## 2014-10-29 VITALS — BP 147/108 | HR 87 | Temp 98.7°F | Resp 18 | Ht 69.0 in | Wt 198.0 lb

## 2014-10-29 DIAGNOSIS — R200 Anesthesia of skin: Secondary | ICD-10-CM

## 2014-10-29 DIAGNOSIS — Z114 Encounter for screening for human immunodeficiency virus [HIV]: Secondary | ICD-10-CM

## 2014-10-29 DIAGNOSIS — M542 Cervicalgia: Secondary | ICD-10-CM

## 2014-10-29 DIAGNOSIS — F17210 Nicotine dependence, cigarettes, uncomplicated: Secondary | ICD-10-CM | POA: Insufficient documentation

## 2014-10-29 DIAGNOSIS — B351 Tinea unguium: Secondary | ICD-10-CM

## 2014-10-29 DIAGNOSIS — I10 Essential (primary) hypertension: Secondary | ICD-10-CM

## 2014-10-29 DIAGNOSIS — H3552 Pigmentary retinal dystrophy: Secondary | ICD-10-CM | POA: Insufficient documentation

## 2014-10-29 DIAGNOSIS — B353 Tinea pedis: Secondary | ICD-10-CM

## 2014-10-29 DIAGNOSIS — Z0000 Encounter for general adult medical examination without abnormal findings: Secondary | ICD-10-CM

## 2014-10-29 DIAGNOSIS — R208 Other disturbances of skin sensation: Secondary | ICD-10-CM

## 2014-10-29 DIAGNOSIS — M25562 Pain in left knee: Secondary | ICD-10-CM

## 2014-10-29 LAB — CBC
HCT: 41.4 % (ref 39.0–52.0)
HEMOGLOBIN: 13.3 g/dL (ref 13.0–17.0)
MCH: 23.8 pg — AB (ref 26.0–34.0)
MCHC: 32.1 g/dL (ref 30.0–36.0)
MCV: 74.2 fL — ABNORMAL LOW (ref 78.0–100.0)
MPV: 10.4 fL (ref 8.6–12.4)
Platelets: 182 10*3/uL (ref 150–400)
RBC: 5.58 MIL/uL (ref 4.22–5.81)
RDW: 18.1 % — ABNORMAL HIGH (ref 11.5–15.5)
WBC: 8.3 10*3/uL (ref 4.0–10.5)

## 2014-10-29 LAB — COMPLETE METABOLIC PANEL WITH GFR
ALK PHOS: 89 U/L (ref 39–117)
ALT: 26 U/L (ref 0–53)
AST: 32 U/L (ref 0–37)
Albumin: 4.5 g/dL (ref 3.5–5.2)
BILIRUBIN TOTAL: 0.5 mg/dL (ref 0.2–1.2)
BUN: 13 mg/dL (ref 6–23)
CO2: 24 meq/L (ref 19–32)
Calcium: 10.2 mg/dL (ref 8.4–10.5)
Chloride: 106 mEq/L (ref 96–112)
Creat: 0.9 mg/dL (ref 0.50–1.35)
GFR, Est African American: >89 mL/min
GLUCOSE: 84 mg/dL (ref 70–99)
Potassium: 4.9 mEq/L (ref 3.5–5.3)
Sodium: 140 mEq/L (ref 135–145)
TOTAL PROTEIN: 7.3 g/dL (ref 6.0–8.3)

## 2014-10-29 LAB — POCT URINALYSIS DIPSTICK
Bilirubin, UA: NEGATIVE
Blood, UA: NEGATIVE
Glucose, UA: NEGATIVE
Ketones, UA: NEGATIVE
Leukocytes, UA: NEGATIVE
Nitrite, UA: NEGATIVE
PROTEIN UA: NEGATIVE
SPEC GRAV UA: 1.025
UROBILINOGEN UA: 1
pH, UA: 6

## 2014-10-29 LAB — TSH: TSH: 0.91 u[IU]/mL (ref 0.350–4.500)

## 2014-10-29 LAB — VITAMIN B12: VITAMIN B 12: 629 pg/mL (ref 211–911)

## 2014-10-29 MED ORDER — AMLODIPINE BESYLATE 10 MG PO TABS: 10.0000 mg | ORAL_TABLET | Freq: Every day | ORAL | Status: DC

## 2014-10-29 MED ORDER — METHYLPREDNISOLONE ACETATE 40 MG/ML IJ SUSP
40.0000 mg | Freq: Once | INTRAMUSCULAR | Status: AC
  Administered 2014-10-29: 40 mg via INTRA_ARTICULAR

## 2014-10-29 MED ORDER — GABAPENTIN 300 MG PO CAPS: 300.0000 mg | ORAL_CAPSULE | Freq: Every day | ORAL | Status: DC

## 2014-10-29 NOTE — Assessment & Plan Note (Addendum)
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 Soft tissue injury P: You have received a shot of steroid in your joint today. Rest and ice knee today. Regular activity tomorrow. Look out for redness, swelling, fever,severe pain in joint and call if you experience these symptoms. X-ray of knee ordered  Planning for MRI of L knee as I suspect a possible soft tissue injury

## 2014-10-29 NOTE — Assessment & Plan Note (Signed)
Hand numbness: Start gabapentin 300 mg at night tonight Evaluate for cause: start with labs vit B12, TSH, etc, F/u with MRI of neck

## 2014-10-29 NOTE — Telephone Encounter (Signed)
Junious Dresser, Please call patient and let him know that I ordered x-ray of his neck and L knee for him to get done at [facility] at his earliest convenience.   Thank you,  Dr. Adrian Blackwater

## 2014-10-29 NOTE — Patient Instructions (Addendum)
Mr. Mottern,  Thank you for coming in today. It was a pleasure meeting you. I look forward to being your primary doctor.  1. L knee pain: You have received a shot of steroid in your joint today. Rest and ice knee today. Regular activity tomorrow. Look out for redness, swelling, fever,severe pain in joint and call if you experience these symptoms. Planning for MRI of L knee as I suspect a possible soft tissue injury   2. Hand numbness: Start gabapentin 300 mg at night tonight Evaluate for cause: start with labs vit B12, TSH, etc, F/u with MRI of neck   3. HTN:  Increase norvasc to 10 mg daily  DASH diet Stop smoking Limit alcohol to 2 drinks   4. Smoking: Smoking cessation support: smoking cessation hotline: 1-800-QUIT-NOW.  Smoking cessation classes are available through [facility]. Call [phone] or visit our website at [email or web address]  5. 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   Dr. Adrian Blackwater

## 2014-10-29 NOTE — Assessment & Plan Note (Signed)
A; L sided neck pain with b/l hand numbness, ? Cervical radiculopathy from DJD P: Neck x-ray Likely f/u MRI Gabapentin

## 2014-10-29 NOTE — Assessment & Plan Note (Signed)
A: noted on exam. No pain P:  CMP Plan for oral lamisil

## 2014-10-29 NOTE — Telephone Encounter (Signed)
LVM to return call.

## 2014-10-29 NOTE — Assessment & Plan Note (Signed)
HTN:  Increase norvasc to 10 mg daily  DASH diet Stop smoking Limit alcohol to 2 drinks   4. Smoking

## 2014-10-29 NOTE — Assessment & Plan Note (Signed)
Screening HIV ordered  

## 2014-10-29 NOTE — Progress Notes (Signed)
Subjective:    Patient ID: Darren Larsen, male    DOB: 19-Sep-1965, 49 y.o.   MRN: [id number] CC: establish care, HTN,  B/l hand numbness, L knee pain  HPI  1. HTN: taking norvasc 5 mg daily. Smoking. Would like to quit. No CP, SOB, edema.  2. B/l hand numbness: x 1 years. Both hands. No pain in hands. Theres is some pain in L posterior neck and shoulder. No injury other than a remote fall at work. Patient smokes. Patient drink ETOH 48-72 oz of beer daily. No hx of DM2. There numbness is interfering with his quality of life and intimacy. He works normally.  2. L knee pain: x 1 year. Fall at work otherwise no injury. X-ray revealed mild medial compartment narrowing only. Pain is lateral and posterior. Knee gives out. Pain with climbing stairs and ladders. Ice, NSAID, vicodin helped some but not much.   Med Hx: HTN Surg Hx: none  History   Social History  . Marital Status: Legally Separated    Spouse Name: N/A  . Number of Children: 5  . Years of Education: 12    Occupational History  . Carpentry    . Concrete     Social History Main Topics  . Smoking status: Current Every Day Smoker -- 0.50 packs/day for 20 years    Types: Cigarettes  . Smokeless tobacco: Never Used  . Alcohol Use: Yes     Comment: daily beer, 2-3, 24 oz   . Drug Use: Not on file  . Sexual Activity: Not on file   Other Topics Concern  . None   Social History Narrative   Live with niece   Children grown and gone.   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     Review of Systems  Constitutional: Negative for fever, chills, activity change and unexpected weight change.  HENT: Negative for dental problem.   Eyes: Negative for visual disturbance.  Respiratory: Negative for cough and shortness of breath.   Cardiovascular: Negative for chest pain, palpitations and leg swelling.  Gastrointestinal: Negative for abdominal pain.  Endocrine:  Negative for polydipsia, polyphagia and polyuria.  Skin: Positive for rash. Negative for wound.  Allergic/Immunologic: Negative for immunocompromised state.  Neurological: Positive for weakness and numbness. Negative for dizziness, tremors, speech difficulty, light-headedness and headaches.       Objective:   Physical Exam BP 147/108 mmHg  Pulse 87  Temp(Src) 98.7 F (37.1 C) (Oral)  Resp 18  Ht 5\' 9"  (1.753 m)  Wt 198 lb (89.812 kg)  BMI 29.23 kg/m2  SpO2 98% General appearance: alert, cooperative and no distress Eyes: conjunctivae/corneas clear. PERRL, pupils restricted, EOM's intact.  Throat: poor dentition, no swelling  Neck: no adenopathy, supple, symmetrical, trachea midline and thyroid not enlarged, symmetric, no tenderness/mass/nodules  Neck: Negative spurling's Full neck range of motion Grip strength normal in hands. Sensation decreased in both hands Strength good C4 to T1 distribution Reflexes decreased in UE Lungs: clear to auscultation bilaterally Heart: regular rate and rhythm, S1, S2 normal, no murmur, click, rub or gallop Extremities: extremities normal, atraumatic, no cyanosis or edema thickening and darkening of toes with hyperpigmented flaky papules on lateral aspect of feet  L knee: Full ROM, no effusion, TTP posterior knee. No joint line or PT tenderness.  Pulses: 2+ and symmetric Skin: Skin color, texture, turgor normal. No rashes or lesions Neurologic: Grossly normal  except for sensory deficits in hands    After obtaining informed consent and cleaning the skin using iodine and alcohol a  steroid injection was performed at L knee using 1% Lidocaine and 40 mg of Depo Medrol. This was well tolerated       Assessment & Plan:

## 2014-10-29 NOTE — Progress Notes (Signed)
Patient here to establish care. Patient has history of hypertension-dx 4 years ago. Patient has complaints of bilateral hand numbness and left knee pain and numbness. Current pain-6/10. Patient has script for Vicodin but indicates it is not working. Patient indicates pain and numbness started a year ago after a fall.

## 2014-10-30 LAB — HIV ANTIBODY (ROUTINE TESTING W REFLEX): HIV 1&2 Ab, 4th Generation: NONREACTIVE

## 2014-10-30 LAB — VITAMIN D 25 HYDROXY (VIT D DEFICIENCY, FRACTURES): Vit D, 25-Hydroxy: 31 ng/mL (ref 30–100)

## 2014-10-31 MED ORDER — TERBINAFINE HCL 250 MG PO TABS: 250.0000 mg | ORAL_TABLET | Freq: Every day | ORAL | Status: DC

## 2014-10-31 NOTE — Telephone Encounter (Signed)
-----   Message from Boykin Nearing, MD sent at 10/31/2014  8:52 AM EDT ----- All labs normal including liver function test Sent in lamsil for foot and toenail fungus  No B12 deficiency or abnormal TSH to account for hand numbness   Pt to go for x-ray of neck and knee

## 2014-10-31 NOTE — Addendum Note (Signed)
Addended by: Boykin Nearing on: 10/31/2014 08:53 AM   Modules accepted: Orders

## 2014-10-31 NOTE — Telephone Encounter (Signed)
Unable to contact pt Busy line  x2

## 2015-11-14 ENCOUNTER — Emergency Department (HOSPITAL_COMMUNITY)
Admission: EM | Admit: 2015-11-14 | Discharge: 2015-11-14 | Disposition: A | Discharge status: Home / Self Care | Payer: Self-pay | Attending: Emergency Medicine | Admitting: Emergency Medicine

## 2015-11-14 ENCOUNTER — Emergency Department (HOSPITAL_COMMUNITY): Payer: Self-pay

## 2015-11-14 ENCOUNTER — Encounter (HOSPITAL_COMMUNITY): Payer: Self-pay | Admitting: Emergency Medicine

## 2015-11-14 DIAGNOSIS — N433 Hydrocele, unspecified: Secondary | ICD-10-CM | POA: Insufficient documentation

## 2015-11-14 DIAGNOSIS — I10 Essential (primary) hypertension: Secondary | ICD-10-CM | POA: Insufficient documentation

## 2015-11-14 DIAGNOSIS — Z791 Long term (current) use of non-steroidal anti-inflammatories (NSAID): Secondary | ICD-10-CM | POA: Insufficient documentation

## 2015-11-14 DIAGNOSIS — F17210 Nicotine dependence, cigarettes, uncomplicated: Secondary | ICD-10-CM | POA: Insufficient documentation

## 2015-11-14 DIAGNOSIS — R200 Anesthesia of skin: Secondary | ICD-10-CM

## 2015-11-14 DIAGNOSIS — Z79899 Other long term (current) drug therapy: Secondary | ICD-10-CM | POA: Insufficient documentation

## 2015-11-14 DIAGNOSIS — G40909 Epilepsy, unspecified, not intractable, without status epilepticus: Secondary | ICD-10-CM | POA: Insufficient documentation

## 2015-11-14 MED ORDER — AMLODIPINE BESYLATE 10 MG PO TABS: 10.0000 mg | ORAL_TABLET | Freq: Every day | ORAL | Status: DC

## 2015-11-14 MED ORDER — ACETAMINOPHEN 325 MG PO TABS
650.0000 mg | ORAL_TABLET | Freq: Once | ORAL | Status: AC
  Administered 2015-11-14: 650 mg via ORAL
  Filled 2015-11-14: qty 2

## 2015-11-14 MED ORDER — GABAPENTIN 300 MG PO CAPS: 300.0000 mg | ORAL_CAPSULE | Freq: Every day | ORAL | Status: DC

## 2015-11-14 NOTE — ED Provider Notes (Signed)
CSN: [id number]     Arrival date & time 11/14/15  1308 History   First MD Initiated Contact with Patient 11/14/15 1320     Chief Complaint  Patient presents with  . Hernia     (Consider location/radiation/quality/duration/timing/severity/associated sxs/prior Treatment) HPI  50 year old male presents with left scrotal pain and swelling. Patient states this has been ongoing for months if not over one year. Seems to be worse over the last 1 week. He states he is mostly tired of having it and so he came to the ER to get it checked out. Patient states he's had a left inguinal hernia repaired about 21 years ago. He thinks this felt similar. 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 No discharge. No dysuria or hematuria. Denies any abdominal pain, nausea, vomiting, or constipation. Pain has not severely or suddenly worsened.  Past Medical History  Diagnosis Date  . Seizures ([place])   . Hypertension   . Glaucoma   . Retinitis pigmentosa congenital    Past Surgical History  Procedure Laterality Date  . Hernia repair     No family history on file. Social History  Substance Use Topics  . Smoking status: Current Every Day Smoker -- 0.50 packs/day for 20 years    Types: Cigarettes  . Smokeless tobacco: Never Used  . Alcohol Use: Yes     Comment: daily beer, 2-3, 24 oz     Review of Systems  Gastrointestinal: Negative for nausea, vomiting, abdominal pain and constipation.  Genitourinary: Positive for scrotal swelling. Negative for dysuria, discharge and penile pain.  All other systems reviewed and are negative.     Allergies  Aspirin  Home Medications   Prior to Admission medications   Medication Sig Start Date End Date Taking? Authorizing Provider  amLODipine (NORVASC) 10 MG tablet Take 1 tablet (10 mg total) by mouth daily. 10/29/14   Josalyn Funches, MD  gabapentin (NEURONTIN) 300 MG capsule Take 1 capsule (300 mg  total) by mouth at bedtime. 10/29/14   Josalyn Funches, MD  ibuprofen (ADVIL,MOTRIN) 200 MG tablet Take 400 mg by mouth every 6 (six) hours as needed for headache or moderate pain.    Historical Provider, MD  multivitamin (ONE-A-DAY MEN'S) TABS tablet Take 1 tablet by mouth daily. 09/23/14   Dorie Rank, MD  Omega-3 Fatty Acids (FISH OIL PO) Take 1 capsule by mouth 3 (three) times daily.    Historical Provider, MD  terbinafine (LAMISIL) 250 MG tablet Take 1 tablet (250 mg total) by mouth daily. 10/31/14   Josalyn Funches, MD  tetrahydrozoline 0.05 % ophthalmic solution Place 1 drop into both eyes daily as needed (dry/ red eyes).     Historical Provider, MD  thiamine (VITAMIN B-1) 100 MG tablet Take 1 tablet (100 mg total) by mouth daily. Patient not taking: Reported on 10/29/2014 09/23/14   Dorie Rank, MD   BP 132/99 mmHg  Pulse 105  Temp(Src) 97.5 F (36.4 C) (Oral)  Resp 18  SpO2 100% Physical Exam  Constitutional: He is oriented to person, place, and time. He appears well-developed and well-nourished.  HENT:  Head: Normocephalic and atraumatic.  Right Ear: External ear normal.  Left Ear: External ear normal.  Nose: Nose normal.  Eyes: Right eye exhibits no discharge. Left eye exhibits no discharge.  Neck: Neck supple.  Pulmonary/Chest: Effort normal.  Abdominal: Soft. He exhibits no distension. There is no tenderness.  Genitourinary: Penis normal. Right testis shows  no swelling and no tenderness. Left testis shows swelling. Circumcised.  Left hemi scrotum swollen compared to right.Feels like a solid structure but hard to tell and pinpoint the left testicle. Is mobile. No firmness, redness or tenderness  Musculoskeletal: He exhibits no edema.  Neurological: He is alert and oriented to person, place, and time.  Skin: Skin is warm and dry.  Nursing note and vitals reviewed.   ED Course  Procedures (including critical care time) Labs Review Labs Reviewed - No data to display  Imaging  Review US Scrotum  11/14/2015  CLINICAL DATA:  Swelling in the left scrotum for 1 year.  Pain. EXAM: ULTRASOUND OF SCROTUM TECHNIQUE: Complete ultrasound examination of the testicles, epididymis, and other scrotal structures was performed. COMPARISON:  None. FINDINGS: Right testicle Measurements: 4.9 x 2.0 x 3.1 cm. No mass or microlithiasis visualized. Left testicle Measurements: 4.6 x 3.4 x 2.7 cm. No mass or microlithiasis visualized. Right epididymis:  Normal in size and appearance. Left epididymis:  Not visualized. Hydrocele: There is a large rounded collection of fluid in the left scrotum, displacing the testicle medially. This fluid collection appears to contain at least 1 septation. No right hydrocele. Varicocele:  None visualized. IMPRESSION: 1. There is a rounded fluid collection in the left scrotum with a single thin septation. This could represent a chronic hydrocele. No solid testicular or scrotal masses. Electronically Signed   By: Dorise Bullion III M.D   On: 11/14/2015 14:27   I have personally reviewed and evaluated these images and lab results as part of my medical decision-making.   EKG Interpretation None      MDM   Final diagnoses:  Left hydrocele    Patient's presentation is consistent with a hydrocele. No urinary symptoms. No abdominal symptoms. No obvious bowel on ultrasound. I think this is all represented by hydrocele, especially given chronicity. Will refer to urology given that he is symptomatic. Discussed importance of following up with urology if he wants this to be improved.    Sherwood Gambler, MD 11/14/15 [phone]

## 2015-11-14 NOTE — ED Notes (Signed)
Pt reports L testicular swelling x 2 weeks and pain.  Denies any urinary sxs at this time.

## 2015-11-14 NOTE — ED Notes (Signed)
Per pt, states hernia in testicle-states history of the same-increased symptoms over past couple of days

## 2016-11-02 ENCOUNTER — Other Ambulatory Visit: Payer: Self-pay

## 2016-11-02 ENCOUNTER — Telehealth: Payer: Self-pay | Admitting: Family Medicine

## 2016-11-02 DIAGNOSIS — I10 Essential (primary) hypertension: Secondary | ICD-10-CM

## 2016-11-02 NOTE — Telephone Encounter (Signed)
PT. Called to request a refill for the following medication  amLODipine (NORVASC) 10 MG tablet gabapentin (NEURONTIN) 300 MG capsule  Please follow up with PT if you auth the refill thanks

## 2016-11-02 NOTE — Telephone Encounter (Signed)
Pt was called and informed that an OV is needed for refills.

## 2016-11-02 NOTE — Telephone Encounter (Signed)
Patient called to schedule appointment with PCP for medication refill but PCP does not have anything available. Pt was instructed to call back on May 7th.   Refill Request: amlodipine and gabapentin

## 2016-11-03 MED ORDER — AMLODIPINE BESYLATE 10 MG PO TABS: 10.0000 mg | ORAL_TABLET | Freq: Every day | ORAL | 0 refills | Status: DC

## 2016-11-03 NOTE — Telephone Encounter (Signed)
Sent amlodipine to [facility] listed OV needed for gabapentin

## 2016-11-04 ENCOUNTER — Other Ambulatory Visit: Payer: Self-pay | Admitting: Family Medicine

## 2016-11-04 DIAGNOSIS — R200 Anesthesia of skin: Secondary | ICD-10-CM

## 2016-11-04 NOTE — Telephone Encounter (Signed)
Pt was called and A VM was left informing pt that Amlodipine has been refilled.

## 2016-11-23 ENCOUNTER — Encounter: Payer: Self-pay | Admitting: Family Medicine

## 2017-04-07 ENCOUNTER — Emergency Department (HOSPITAL_COMMUNITY)
Admission: EM | Admit: 2017-04-07 | Discharge: 2017-04-07 | Disposition: A | Discharge status: Home / Self Care | Payer: Self-pay | Attending: Emergency Medicine | Admitting: Emergency Medicine

## 2017-04-07 ENCOUNTER — Encounter (HOSPITAL_COMMUNITY): Payer: Self-pay

## 2017-04-07 DIAGNOSIS — M79605 Pain in left leg: Secondary | ICD-10-CM | POA: Insufficient documentation

## 2017-04-07 DIAGNOSIS — Z5321 Procedure and treatment not carried out due to patient leaving prior to being seen by health care provider: Secondary | ICD-10-CM | POA: Insufficient documentation

## 2017-04-07 NOTE — ED Triage Notes (Signed)
Patient c/o left inguinal hernia and left leg pain. Patient states the swelling and leg pain have gotten progressively worse.

## 2017-04-13 ENCOUNTER — Emergency Department (HOSPITAL_COMMUNITY)
Admission: EM | Admit: 2017-04-13 | Discharge: 2017-04-13 | Disposition: A | Discharge status: Home / Self Care | Payer: Self-pay | Attending: Emergency Medicine | Admitting: Emergency Medicine

## 2017-04-13 ENCOUNTER — Encounter (HOSPITAL_COMMUNITY): Payer: Self-pay | Admitting: Emergency Medicine

## 2017-04-13 DIAGNOSIS — E119 Type 2 diabetes mellitus without complications: Secondary | ICD-10-CM | POA: Insufficient documentation

## 2017-04-13 DIAGNOSIS — Z859 Personal history of malignant neoplasm, unspecified: Secondary | ICD-10-CM | POA: Insufficient documentation

## 2017-04-13 DIAGNOSIS — F17210 Nicotine dependence, cigarettes, uncomplicated: Secondary | ICD-10-CM | POA: Insufficient documentation

## 2017-04-13 DIAGNOSIS — I10 Essential (primary) hypertension: Secondary | ICD-10-CM | POA: Insufficient documentation

## 2017-04-13 DIAGNOSIS — Z760 Encounter for issue of repeat prescription: Secondary | ICD-10-CM | POA: Insufficient documentation

## 2017-04-13 DIAGNOSIS — Z79899 Other long term (current) drug therapy: Secondary | ICD-10-CM | POA: Insufficient documentation

## 2017-04-13 DIAGNOSIS — R200 Anesthesia of skin: Secondary | ICD-10-CM

## 2017-04-13 DIAGNOSIS — R1032 Left lower quadrant pain: Secondary | ICD-10-CM | POA: Insufficient documentation

## 2017-04-13 DIAGNOSIS — N5089 Other specified disorders of the male genital organs: Secondary | ICD-10-CM | POA: Insufficient documentation

## 2017-04-13 DIAGNOSIS — M25562 Pain in left knee: Secondary | ICD-10-CM | POA: Insufficient documentation

## 2017-04-13 DIAGNOSIS — G8929 Other chronic pain: Secondary | ICD-10-CM | POA: Insufficient documentation

## 2017-04-13 MED ORDER — GABAPENTIN 300 MG PO CAPS: 300.0000 mg | ORAL_CAPSULE | Freq: Every day | ORAL | 0 refills | Status: DC

## 2017-04-13 MED ORDER — AMLODIPINE BESYLATE 10 MG PO TABS: 10.0000 mg | ORAL_TABLET | Freq: Every day | ORAL | 0 refills | Status: DC

## 2017-04-13 NOTE — ED Provider Notes (Signed)
[facility] DEPT Provider Note   CSN: [id number] Arrival date & time: 04/13/17  1349     History   Chief Complaint Chief Complaint  Patient presents with  . Knee Pain  . Groin Pain    HPI Darren Larsen is a 51 y.o. male.  HPI 51 year old male with a history of left testicular hydrocele presents to the emergency department with persistent left testicular swelling and discomfort. He reports this is been ongoing for several years and was seen here last year, when he obtained an ultrasound that revealed a hydrocele. He was recommended to follow-up with a urologist but was unable to due to financial reasons. Patient denies any dysuria, penile discharge. Pain has been ongoing for several years and that she waning in nature. No acute trauma. No recent fevers or other urinary symptoms.  Patient also here with chronic left knee pain, dull/throbbing in nature. Exacerbated with palpation and ambulation. Denies any recent trauma.  In addition patient also states that he is out of his high blood pressure medication. Denies any chest pain, shortness of breath, leg swelling.  Past Medical History:  Diagnosis Date  . Cancer ([place])   . Diabetes mellitus without complication ([place])   . Glaucoma   . Hypertension   . Retinitis pigmentosa congenital   . Seizures [facility])     Patient Active Problem List   Diagnosis Date Noted  . Left knee pain 10/29/2014  . Bilateral hand numbness 10/29/2014  . Neck pain on left side 10/29/2014  . Tinea pedis of both feet 10/29/2014  . Onychomycosis of toenail 10/29/2014  . HTN (hypertension) 10/29/2014  . Screening for HIV (human immunodeficiency virus) 10/29/2014  . Retinitis pigmentosa     Past Surgical History:  Procedure Laterality Date  . HERNIA REPAIR         Home Medications    Prior to Admission medications   Medication Sig Start Date End Date Taking? Authorizing Provider  amLODipine (NORVASC) 10 MG tablet Take 1 tablet (10 mg total) by  mouth daily. 04/13/17   Fatima Blank, MD  gabapentin (NEURONTIN) 300 MG capsule Take 1 capsule (300 mg total) by mouth at bedtime. 04/13/17 05/13/17  Fatima Blank, MD  ibuprofen (ADVIL,MOTRIN) 200 MG tablet Take 400 mg by mouth every 6 (six) hours as needed for headache or moderate pain.    [provider]  multivitamin (ONE-A-DAY MEN'S) TABS tablet Take 1 tablet by mouth daily. Patient not taking: Reported on 11/14/2015 09/23/14   Dorie Rank, MD  terbinafine (LAMISIL) 250 MG tablet Take 1 tablet (250 mg total) by mouth daily. Patient not taking: Reported on 11/14/2015 10/31/14   Boykin Nearing, MD  thiamine (VITAMIN B-1) 100 MG tablet Take 1 tablet (100 mg total) by mouth daily. Patient not taking: Reported on 10/29/2014 09/23/14   Dorie Rank, MD    Family History Family History  Problem Relation Age of Onset  . Hypertension Mother     Social History Social History  Substance Use Topics  . Smoking status: Current Every Day Smoker    Packs/day: 0.50    Years: 20.00    Types: Cigarettes  . Smokeless tobacco: Never Used  . Alcohol use Yes     Comment: daily beer, 2-3, 24 oz      Allergies   Aspirin   Review of Systems Review of Systems All other systems are reviewed and are negative for acute change except as noted in the HPI   Physical Exam Updated Vital Signs  BP (!) 162/109 (BP Location: Left Arm)   Pulse 83   Temp 98.1 F (36.7 C) (Oral)   Resp 18   Ht 5\' 9"  (1.753 m)   Wt 95.7 kg (211 lb)   SpO2 98%   BMI 31.16 kg/m   Physical Exam  Constitutional: He is oriented to person, place, and time. He appears well-developed and well-nourished. No distress.  HENT:  Head: Normocephalic and atraumatic.  Nose: Nose normal.  Eyes: Pupils are equal, round, and reactive to light. Conjunctivae and EOM are normal. Right eye exhibits no discharge. Left eye exhibits no discharge. No scleral icterus.  Neck: Normal range of motion. Neck supple.    Cardiovascular: Normal rate and regular rhythm.  Exam reveals no gallop and no friction rub.   No murmur heard. Pulmonary/Chest: Effort normal and breath sounds normal. No stridor. No respiratory distress. He has no rales.  Abdominal: Soft. He exhibits no distension. There is no tenderness. Hernia confirmed negative in the right inguinal area and confirmed negative in the left inguinal area.  Genitourinary: Penis normal. Right testis shows no mass, no swelling and no tenderness. Left testis shows swelling and tenderness (mild discomfort). Left testis shows no mass. Left testis is descended. Circumcised.  Musculoskeletal: He exhibits no edema.       Left knee: He exhibits normal range of motion, no swelling, no effusion and no erythema. Tenderness found.  Neurological: He is alert and oriented to person, place, and time.  Skin: Skin is warm and dry. No rash noted. He is not diaphoretic. No erythema.  Psychiatric: He has a normal mood and affect.  Vitals reviewed.    ED Treatments / Results  Labs (all labs ordered are listed, but only abnormal results are displayed) Labs Reviewed - No data to display  EKG  EKG Interpretation None       Radiology No results found.  Procedures Procedures (including critical care time)  Medications Ordered in ED Medications - No data to display   Initial Impression / Assessment and Plan / ED Course  I have reviewed the triage vital signs and the nursing notes.  Pertinent labs & imaging results that were available during my care of the patient were reviewed by me and considered in my medical decision making (see chart for details).     1. Left testicular swelling and discomfort Likely secondary to hydrocele. Low suspicion for torsion or epididymitis. No hernia noted. Recommended urology follow-up.  2. Chronic left knee pain. No evidence to suggest septic arthritis, gout, DVT. No recent trauma requiring imaging at this time.  3. Medication  refill Provide the patient with his amlodipine and gabapentin.  The patient is safe for discharge with strict return precautions.   Final Clinical Impressions(s) / ED Diagnoses   Final diagnoses:  Left inguinal pain  Chronic pain of left knee   Disposition: Discharge  Condition: Good  I have discussed the results, Dx and Tx plan with the patient who expressed understanding and agree(s) with the plan. Discharge instructions discussed at great length. The patient was given strict return precautions who verbalized understanding of the instructions. No further questions at time of discharge.    Current Discharge Medication List      Follow Up: Primary care provider     Urology         Fatima Blank, MD 04/13/17 2003

## 2017-04-13 NOTE — ED Triage Notes (Signed)
Pt c/o L knee pain, chronic x 2 years but worsening and not relieved with Otc meds. Pt also c/o L groin pain x several months but states pain is limiting his ambulation and work. Pt states he has a known hernia.

## 2018-02-05 ENCOUNTER — Emergency Department (HOSPITAL_COMMUNITY)
Admission: EM | Admit: 2018-02-05 | Discharge: 2018-02-05 | Disposition: A | Discharge status: Home / Self Care | Payer: Self-pay | Attending: Emergency Medicine | Admitting: Emergency Medicine

## 2018-02-05 ENCOUNTER — Other Ambulatory Visit: Payer: Self-pay

## 2018-02-05 ENCOUNTER — Emergency Department (HOSPITAL_COMMUNITY): Payer: Self-pay

## 2018-02-05 ENCOUNTER — Encounter (HOSPITAL_COMMUNITY): Payer: Self-pay | Admitting: Emergency Medicine

## 2018-02-05 DIAGNOSIS — E119 Type 2 diabetes mellitus without complications: Secondary | ICD-10-CM | POA: Insufficient documentation

## 2018-02-05 DIAGNOSIS — N5089 Other specified disorders of the male genital organs: Secondary | ICD-10-CM | POA: Insufficient documentation

## 2018-02-05 DIAGNOSIS — R200 Anesthesia of skin: Secondary | ICD-10-CM

## 2018-02-05 DIAGNOSIS — Z79899 Other long term (current) drug therapy: Secondary | ICD-10-CM | POA: Insufficient documentation

## 2018-02-05 DIAGNOSIS — I10 Essential (primary) hypertension: Secondary | ICD-10-CM | POA: Insufficient documentation

## 2018-02-05 DIAGNOSIS — F17210 Nicotine dependence, cigarettes, uncomplicated: Secondary | ICD-10-CM | POA: Insufficient documentation

## 2018-02-05 LAB — CBC WITH DIFFERENTIAL/PLATELET
Abs Immature Granulocytes: 0 10*3/uL (ref 0.0–0.1)
BASOS PCT: 1 %
Basophils Absolute: 0.1 10*3/uL (ref 0.0–0.1)
EOS ABS: 0.2 10*3/uL (ref 0.0–0.7)
EOS PCT: 3 %
HEMATOCRIT: 42 % (ref 39.0–52.0)
Hemoglobin: 13.2 g/dL (ref 13.0–17.0)
IMMATURE GRANULOCYTES: 0 %
Lymphocytes Relative: 30 %
Lymphs Abs: 2.3 10*3/uL (ref 0.7–4.0)
MCH: 23.6 pg — ABNORMAL LOW (ref 26.0–34.0)
MCHC: 31.4 g/dL (ref 30.0–36.0)
MCV: 75 fL — AB (ref 78.0–100.0)
MONOS PCT: 14 %
Monocytes Absolute: 1.1 10*3/uL — ABNORMAL HIGH (ref 0.1–1.0)
NEUTROS PCT: 52 %
Neutro Abs: 4.1 10*3/uL (ref 1.7–7.7)
PLATELETS: 207 10*3/uL (ref 150–400)
RBC: 5.6 MIL/uL (ref 4.22–5.81)
RDW: 17.7 % — AB (ref 11.5–15.5)
WBC: 7.8 10*3/uL (ref 4.0–10.5)

## 2018-02-05 LAB — COMPREHENSIVE METABOLIC PANEL
ALK PHOS: 99 U/L (ref 38–126)
ALT: 21 U/L (ref 0–44)
AST: 27 U/L (ref 15–41)
Albumin: 4 g/dL (ref 3.5–5.0)
Anion gap: 9 (ref 5–15)
BUN: 13 mg/dL (ref 6–20)
CALCIUM: 9.4 mg/dL (ref 8.9–10.3)
CO2: 24 mmol/L (ref 22–32)
CREATININE: 0.9 mg/dL (ref 0.61–1.24)
Chloride: 109 mmol/L (ref 98–111)
Glucose, Bld: 110 mg/dL — ABNORMAL HIGH (ref 70–99)
Potassium: 4.1 mmol/L (ref 3.5–5.1)
Sodium: 142 mmol/L (ref 135–145)
Total Bilirubin: 0.7 mg/dL (ref 0.3–1.2)
Total Protein: 7.2 g/dL (ref 6.5–8.1)

## 2018-02-05 LAB — LIPASE, BLOOD: LIPASE: 51 U/L (ref 11–51)

## 2018-02-05 MED ORDER — AMLODIPINE BESYLATE 10 MG PO TABS: 10.0000 mg | ORAL_TABLET | Freq: Every day | ORAL | 1 refills | Status: DC

## 2018-02-05 MED ORDER — GABAPENTIN 300 MG PO CAPS: 300.0000 mg | ORAL_CAPSULE | Freq: Every day | ORAL | 0 refills | Status: DC

## 2018-02-05 MED ORDER — HYDROCODONE-ACETAMINOPHEN 5-325 MG PO TABS: 1.0000 | ORAL_TABLET | Freq: Four times a day (QID) | ORAL | 0 refills | Status: DC | PRN

## 2018-02-05 MED ORDER — OXYCODONE-ACETAMINOPHEN 5-325 MG PO TABS
1.0000 | ORAL_TABLET | Freq: Once | ORAL | Status: AC
  Administered 2018-02-05: 1 via ORAL
  Filled 2018-02-05: qty 1

## 2018-02-05 NOTE — ED Notes (Signed)
Pt called x6 no response.

## 2018-02-05 NOTE — ED Provider Notes (Signed)
[facility] EMERGENCY DEPARTMENT Provider Note   CSN: [id number] Arrival date & time: 02/05/18  1302     History   Chief Complaint Chief Complaint  Patient presents with  . Groin Swelling    HPI Darren Larsen is a 52 y.o. male.  HPI   52 year old male with pain and swelling in his left scrotum.  This has been going on for at least months.  Progressively worsening.  Worse with movement/activity.  Getting to the point where it is really interfering with work and given just day-to-day activities.  No urinary complaints.  No obstructive symptoms.  Past Medical History:  Diagnosis Date  . Cancer ([place])   . Diabetes mellitus without complication ([place])   . Glaucoma   . Hypertension   . Retinitis pigmentosa congenital   . Seizures [facility])     Patient Active Problem List   Diagnosis Date Noted  . Left knee pain 10/29/2014  . Bilateral hand numbness 10/29/2014  . Neck pain on left side 10/29/2014  . Tinea pedis of both feet 10/29/2014  . Onychomycosis of toenail 10/29/2014  . HTN (hypertension) 10/29/2014  . Screening for HIV (human immunodeficiency virus) 10/29/2014  . Retinitis pigmentosa     Past Surgical History:  Procedure Laterality Date  . HERNIA REPAIR          Home Medications    Prior to Admission medications   Medication Sig Start Date End Date Taking? Authorizing Provider  amLODipine (NORVASC) 10 MG tablet Take 1 tablet (10 mg total) by mouth daily. 02/05/18   Virgel Manifold, MD  gabapentin (NEURONTIN) 300 MG capsule Take 1 capsule (300 mg total) by mouth at bedtime. 02/05/18 03/07/18  Virgel Manifold, MD  HYDROcodone-acetaminophen (NORCO/VICODIN) 5-325 MG tablet Take 1 tablet by mouth every 6 (six) hours as needed. 02/05/18   Virgel Manifold, MD  ibuprofen (ADVIL,MOTRIN) 200 MG tablet Take 400 mg by mouth every 6 (six) hours as needed for headache or moderate pain.    [provider]  multivitamin (ONE-A-DAY MEN'S) TABS tablet Take 1  tablet by mouth daily. Patient not taking: Reported on 11/14/2015 09/23/14   Dorie Rank, MD  terbinafine (LAMISIL) 250 MG tablet Take 1 tablet (250 mg total) by mouth daily. Patient not taking: Reported on 11/14/2015 10/31/14   Boykin Nearing, MD  thiamine (VITAMIN B-1) 100 MG tablet Take 1 tablet (100 mg total) by mouth daily. Patient not taking: Reported on 10/29/2014 09/23/14   Dorie Rank, MD    Family History Family History  Problem Relation Age of Onset  . Hypertension Mother     Social History Social History   Tobacco Use  . Smoking status: Current Every Day Smoker    Packs/day: 0.50    Years: 20.00    Pack years: 10.00    Types: Cigarettes  . Smokeless tobacco: Never Used  Substance Use Topics  . Alcohol use: Yes    Comment: daily beer, 2-3, 24 oz   . Drug use: Yes    Types: Marijuana     Allergies   Aspirin   Review of Systems Review of Systems  All systems reviewed and negative, other than as noted in HPI.  Physical Exam Updated Vital Signs BP (!) 152/115 (BP Location: Right Arm)   Pulse 68   Temp 98.3 F (36.8 C)   Resp 18   SpO2 97%   Physical Exam  Constitutional: He appears well-developed and well-nourished. No distress.  HENT:  Head: Normocephalic and atraumatic.  Eyes: Conjunctivae are normal. Right eye exhibits no discharge. Left eye exhibits no discharge.  Neck: Neck supple.  Cardiovascular: Normal rate, regular rhythm and normal heart sounds. Exam reveals no gallop and no friction rub.  No murmur heard. Pulmonary/Chest: Effort normal and breath sounds normal. No respiratory distress.  Abdominal: Soft. He exhibits no distension. There is no tenderness.  Genitourinary:  Genitourinary Comments: Large left scrotal mass.  He actually has fairly minimal tenderness.  No overlying skin changes.  He really does not have any significant fullness into the inguinal canal.  Musculoskeletal: He exhibits no edema or tenderness.  Neurological: He is alert.    Skin: Skin is warm and dry.  Psychiatric: He has a normal mood and affect. His behavior is normal. Thought content normal.  Nursing note and vitals reviewed.    ED Treatments / Results  Labs (all labs ordered are listed, but only abnormal results are displayed) Labs Reviewed  CBC WITH DIFFERENTIAL/PLATELET - Abnormal; Notable for the following components:      Result Value   MCV 75.0 (*)    MCH 23.6 (*)    RDW 17.7 (*)    Monocytes Absolute 1.1 (*)    All other components within normal limits  COMPREHENSIVE METABOLIC PANEL - Abnormal; Notable for the following components:   Glucose, Bld 110 (*)    All other components within normal limits  LIPASE, BLOOD    EKG None  Radiology US Scrotum W/doppler  Result Date: 02/05/2018 CLINICAL DATA:  LEFT scrotal swelling for 2 months. EXAM: SCROTAL ULTRASOUND DOPPLER ULTRASOUND OF THE TESTICLES TECHNIQUE: Complete ultrasound examination of the testicles, epididymis, and other scrotal structures was performed. Color and spectral Doppler ultrasound were also utilized to evaluate blood flow to the testicles. COMPARISON:  None. FINDINGS: Right testicle Measurements: 4.3 x 1.9 x 2.8 cm.  Homogeneous echotexture. Left testicle Measurements: 5.1 x 2.5 x 4.7 cm. Homogeneous echotexture. Superior to the LEFT testicle there is a large anechoic cystic complex measuring 10.6 x 7.5 x 8.8 cm. This cyst is predominately unilocular. Right epididymis:  Normal in size and appearance. Left epididymis:  Not identified.  Superior to the LEFT Hydrocele:  Absent Varicocele:  Abscess Pulsed Doppler interrogation of both testes demonstrates normal low resistance arterial and venous waveforms bilaterally. IMPRESSION: 1. Large benign-appearing unilocular cyst superior to the LEFT testicle measuring up to 10 cm. A common differential would include LARGE SPERMATOCELE. Recommend urology consultation. 2. Normal testicles. Normal arteriovenous waveforms within the LEFT and RIGHT  testicle. Electronically Signed   By: Suzy Bouchard M.D.   On: 02/05/2018 15:47    Procedures Procedures (including critical care time)  Medications Ordered in ED Medications  oxyCODONE-acetaminophen (PERCOCET/ROXICET) 5-325 MG per tablet 1 tablet (1 tablet Oral Given 02/05/18 1317)     Initial Impression / Assessment and Plan / ED Course  I have reviewed the triage vital signs and the nursing notes.  Pertinent labs & imaging results that were available during my care of the patient were reviewed by me and considered in my medical decision making (see chart for details).     52 year old male with large left scrotal mass.  Possibly spermatocele based on ultrasound.  No abnormal vascular flow.  Exam is not consistent with an inguinal hernia.  Outpatient urology follow-up for further evaluation and probably surgery given the size and functional limitations he is having because of discomfort.  Will prescribe pain medicine to take as needed.  Also prescribed gabapentin and Norvasc which she has  been on previously.  Advised he needs to follow-up with his PCP otherwise.  Final Clinical Impressions(s) / ED Diagnoses   Final diagnoses:  Scrotal swelling    ED Discharge Orders        Ordered    amLODipine (NORVASC) 10 MG tablet  Daily     02/05/18 1615    gabapentin (NEURONTIN) 300 MG capsule  Daily at bedtime    Note to Pharmacy:  Office visit needed for refills   02/05/18 1615    HYDROcodone-acetaminophen (NORCO/VICODIN) 5-325 MG tablet  Every 6 hours PRN     02/05/18 1615       Virgel Manifold, MD 02/05/18 1621

## 2018-02-05 NOTE — ED Notes (Signed)
Pt given d/c instructions and prescriptions along with contact info for the [facility], at pt request. Pt ambulated to lobby without further concern.

## 2018-02-05 NOTE — ED Notes (Signed)
Pt in ultrasound

## 2018-02-05 NOTE — ED Provider Notes (Signed)
Patient placed in Quick Look pathway, seen and evaluated   Chief Complaint: Left scrotal swelling and pain  HPI: Patient presents for evaluation of worsening left scrotal swelling and pain over the past few months, which became worse today.  He had a history of hernia repair about 20 years ago and has had minor swelling ever since then but it has been getting steadily worse.  Surgery was not done here locally, has not followed up regarding this recently.  ROS: + Left scrotal swelling, testicular pain, -fevers, chills, nausea, vomiting, abdominal pain  Physical Exam:   Gen: No distress  Neuro: Awake and Alert  Skin: Warm    Focused Exam: Abdomen soft, nondistended, nontender to palpation, scrotum with significant swelling over the left side, no overlying erythema or warmth   Initiation of care has begun. The patient has been counseled on the process, plan, and necessity for staying for the completion/evaluation, and the remainder of the medical screening examination    Janet Berlin 02/05/18 1315    Duffy Bruce, MD 02/05/18 [phone]

## 2018-02-05 NOTE — ED Triage Notes (Signed)
Patient complains of progressive testicle swelling for the last several months. States he had a hernia repair twenty years ago and the swelling has been minor ever since, but started getting worse several months ago. Patient alert, oriented, and ambulating independently with steady gait.

## 2018-02-05 NOTE — ED Notes (Signed)
From [place] via wheelchair per rad tech

## 2018-04-08 ENCOUNTER — Emergency Department (HOSPITAL_COMMUNITY): Payer: Self-pay

## 2018-04-08 ENCOUNTER — Emergency Department (HOSPITAL_COMMUNITY)
Admission: EM | Admit: 2018-04-08 | Discharge: 2018-04-08 | Disposition: A | Discharge status: Home / Self Care | Payer: Self-pay | Attending: Emergency Medicine | Admitting: Emergency Medicine

## 2018-04-08 ENCOUNTER — Encounter (HOSPITAL_COMMUNITY): Payer: Self-pay | Admitting: Emergency Medicine

## 2018-04-08 ENCOUNTER — Other Ambulatory Visit: Payer: Self-pay

## 2018-04-08 DIAGNOSIS — M25511 Pain in right shoulder: Secondary | ICD-10-CM | POA: Insufficient documentation

## 2018-04-08 DIAGNOSIS — Z79899 Other long term (current) drug therapy: Secondary | ICD-10-CM | POA: Insufficient documentation

## 2018-04-08 DIAGNOSIS — E119 Type 2 diabetes mellitus without complications: Secondary | ICD-10-CM | POA: Insufficient documentation

## 2018-04-08 DIAGNOSIS — F17210 Nicotine dependence, cigarettes, uncomplicated: Secondary | ICD-10-CM | POA: Insufficient documentation

## 2018-04-08 DIAGNOSIS — I10 Essential (primary) hypertension: Secondary | ICD-10-CM | POA: Insufficient documentation

## 2018-04-08 MED ORDER — ACETAMINOPHEN 500 MG PO TABS: 500.0000 mg | ORAL_TABLET | Freq: Four times a day (QID) | ORAL | 0 refills | Status: DC | PRN

## 2018-04-08 MED ORDER — METHOCARBAMOL 500 MG PO TABS: 500.0000 mg | ORAL_TABLET | Freq: Two times a day (BID) | ORAL | 0 refills | Status: DC

## 2018-04-08 NOTE — ED Notes (Signed)
Shoulder splint applied to Rt shoulder

## 2018-04-08 NOTE — ED Notes (Signed)
MANUAL BP 157/94
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
## 2018-04-08 NOTE — Discharge Instructions (Signed)
Please read and follow all provided instructions.  You have been seen today for right shoulder pain  Tests performed today include: An x-ray of the affected area - does NOT show any broken bones or dislocations.  Vital signs. See below for your results today.   Home care instructions: -- *PRICE in the first 24-48 hours after injury: Protect (with brace, splint, sling), if given by your provider. -please take your shoulder out of shoulder sling at least once per day and perform shoulder range of motion exercises in order to prevent frozen shoulder. Rest Ice- Do not apply ice pack directly to your skin, place towel or similar between your skin and ice/ice pack. Apply ice for 20 min, then remove for 40 min while awake Compression- Wear brace, elastic bandage, splint as directed by your provider Elevate affected extremity above the level of your heart when not walking around for the first 24-48 hours   Use Ibuprofen (Motrin/Advil) 600mg  every 6 hours as needed for pain (do not exceed max dose in 24 hours, 2400mg )  Follow-up instructions: Please follow-up with your primary care provider or the provided orthopedic physician (bone specialist) if you continue to have significant pain in 1 week. In this case you may have a more severe injury that requires further care.   Return instructions:  Please return if your toes or feet are numb or tingling, appear gray or blue, or you have severe pain (also elevate the leg and loosen splint or wrap if you were given one) Please return to the Emergency Department if you experience worsening symptoms.  Please return if you have any other emergent concerns. Additional Information:  Your vital signs today were: BP (!) 152/119 (BP Location: Left Arm)    Pulse 94    Temp 98.4 F (36.9 C) (Oral)    Resp 16    Ht 5\' 9"  (1.753 m)    Wt 95.3 kg    SpO2 97%    BMI 31.01 kg/m  If your blood pressure (BP) was elevated above 135/85 this visit, please have this  repeated by your doctor within one month. ---------------

## 2018-04-08 NOTE — ED Notes (Signed)
Patient verbalizes understanding of discharge instructions. Opportunity for questioning and answers were provided. Armband removed by staff, pt discharged from ED to home via POV  
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
## 2018-04-17 ENCOUNTER — Encounter: Payer: Self-pay | Admitting: Critical Care Medicine

## 2018-04-17 ENCOUNTER — Ambulatory Visit: Payer: Self-pay | Attending: Critical Care Medicine | Admitting: Critical Care Medicine

## 2018-04-17 VITALS — BP 136/94 | HR 87 | Temp 98.2°F | Resp 22 | Ht 69.0 in | Wt 210.6 lb

## 2018-04-17 DIAGNOSIS — Z87891 Personal history of nicotine dependence: Secondary | ICD-10-CM | POA: Insufficient documentation

## 2018-04-17 DIAGNOSIS — M62838 Other muscle spasm: Secondary | ICD-10-CM | POA: Insufficient documentation

## 2018-04-17 DIAGNOSIS — Z886 Allergy status to analgesic agent status: Secondary | ICD-10-CM | POA: Insufficient documentation

## 2018-04-17 DIAGNOSIS — M25511 Pain in right shoulder: Secondary | ICD-10-CM | POA: Insufficient documentation

## 2018-04-17 DIAGNOSIS — I10 Essential (primary) hypertension: Secondary | ICD-10-CM

## 2018-04-17 DIAGNOSIS — N4340 Spermatocele of epididymis, unspecified: Secondary | ICD-10-CM

## 2018-04-17 DIAGNOSIS — R200 Anesthesia of skin: Secondary | ICD-10-CM

## 2018-04-17 DIAGNOSIS — Z79899 Other long term (current) drug therapy: Secondary | ICD-10-CM | POA: Insufficient documentation

## 2018-04-17 HISTORY — DX: Spermatocele of epididymis, unspecified: N43.40

## 2018-04-17 MED ORDER — METHOCARBAMOL 500 MG PO TABS: 500.0000 mg | ORAL_TABLET | Freq: Two times a day (BID) | ORAL | 6 refills | Status: DC

## 2018-04-17 MED ORDER — AMLODIPINE BESYLATE 10 MG PO TABS: 10.0000 mg | ORAL_TABLET | Freq: Every day | ORAL | 6 refills | Status: DC

## 2018-04-17 MED ORDER — GABAPENTIN 300 MG PO CAPS: 300.0000 mg | ORAL_CAPSULE | Freq: Every day | ORAL | 4 refills | Status: DC

## 2018-04-17 MED ORDER — VITAMIN B-1 100 MG PO TABS: 100.0000 mg | ORAL_TABLET | Freq: Every day | ORAL | 0 refills | Status: DC

## 2018-04-17 MED ORDER — VITAMIN B-1 100 MG PO TABS: 100.0000 mg | ORAL_TABLET | Freq: Every day | ORAL | 6 refills | Status: DC
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
## 2018-04-17 NOTE — Assessment & Plan Note (Signed)
The gabapentin was refilled 300 mg daily
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
## 2018-04-17 NOTE — Assessment & Plan Note (Signed)
Hypertension well controlled We will refill amlodipine

## 2018-04-17 NOTE — Patient Instructions (Signed)
Refills on gabapentin and amlodipine, thiamine along with Robaxin was sent to our pharmacy  An appointment with Dr. Louis Meckel was made and they will call you with the appointment time for this Thursday, October 10 for evaluation of your scrotal edema and pain  No other medication changes were made  A primary care appointment will be obtained  An orthopedic appointment is not necessary

## 2018-04-17 NOTE — Assessment & Plan Note (Signed)
Painful spermatocele of left testicle, this does not appear infected at this time Plan Referral to urology who will see the patient within the next 48 hours

## 2018-05-09 ENCOUNTER — Ambulatory Visit: Payer: Self-pay | Admitting: Family Medicine

## 2018-05-10 MED FILL — METHOCARBAMOL 500 MG TABS: 500 | 30 days supply | Qty: 60 | Fill #0

## 2018-05-10 MED FILL — GABAPENTIN 300 MG CAPSULE: 300 | 30 days supply | Qty: 30 | Fill #0

## 2018-05-10 MED FILL — AMLODIPINE BESYLATE 10 MG T: 10 | 30 days supply | Qty: 30 | Fill #0

## 2018-05-18 ENCOUNTER — Ambulatory Visit: Payer: Self-pay | Attending: Family Medicine | Admitting: Family Medicine

## 2018-05-18 ENCOUNTER — Other Ambulatory Visit: Payer: Self-pay

## 2018-05-18 VITALS — BP 165/99 | HR 84 | Temp 97.4°F | Ht 69.0 in | Wt 211.0 lb

## 2018-05-18 DIAGNOSIS — Z01810 Encounter for preprocedural cardiovascular examination: Secondary | ICD-10-CM | POA: Insufficient documentation

## 2018-05-18 DIAGNOSIS — Z01818 Encounter for other preprocedural examination: Secondary | ICD-10-CM

## 2018-05-18 DIAGNOSIS — M25511 Pain in right shoulder: Secondary | ICD-10-CM

## 2018-05-18 DIAGNOSIS — M62830 Muscle spasm of back: Secondary | ICD-10-CM

## 2018-05-18 DIAGNOSIS — H3552 Pigmentary retinal dystrophy: Secondary | ICD-10-CM

## 2018-05-18 DIAGNOSIS — Z833 Family history of diabetes mellitus: Secondary | ICD-10-CM

## 2018-05-18 DIAGNOSIS — G8929 Other chronic pain: Secondary | ICD-10-CM

## 2018-05-18 DIAGNOSIS — I10 Essential (primary) hypertension: Secondary | ICD-10-CM

## 2018-05-18 LAB — POCT GLYCOSYLATED HEMOGLOBIN (HGB A1C): Hemoglobin A1C: 5.2 % (ref 4.0–5.6)

## 2018-05-18 MED ORDER — DICLOFENAC SODIUM 1 % TD GEL: TRANSDERMAL | 4 refills | Status: DC

## 2018-05-18 MED ORDER — METHOCARBAMOL 500 MG PO TABS: 500.0000 mg | ORAL_TABLET | Freq: Two times a day (BID) | ORAL | 6 refills | Status: DC
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
## 2018-05-19 LAB — CBC WITH DIFFERENTIAL/PLATELET
Basophils Absolute: 0 x10E3/uL (ref 0.0–0.2)
Basos: 1 %
EOS (ABSOLUTE): 0.3 x10E3/uL (ref 0.0–0.4)
Eos: 4 %
Hematocrit: 40.9 % (ref 37.5–51.0)
Hemoglobin: 12.9 g/dL — ABNORMAL LOW (ref 13.0–17.7)
Immature Grans (Abs): 0 x10E3/uL (ref 0.0–0.1)
Immature Granulocytes: 0 %
Lymphocytes Absolute: 2.6 x10E3/uL (ref 0.7–3.1)
Lymphs: 33 %
MCH: 23.3 pg — ABNORMAL LOW (ref 26.6–33.0)
MCHC: 31.5 g/dL (ref 31.5–35.7)
MCV: 74 fL — ABNORMAL LOW (ref 79–97)
Monocytes Absolute: 1.1 x10E3/uL — ABNORMAL HIGH (ref 0.1–0.9)
Monocytes: 14 %
Neutrophils Absolute: 3.8 x10E3/uL (ref 1.4–7.0)
Neutrophils: 48 %
Platelets: 205 x10E3/uL (ref 150–450)
RBC: 5.54 x10E6/uL (ref 4.14–5.80)
RDW: 15.7 % — ABNORMAL HIGH (ref 12.3–15.4)
WBC: 7.9 x10E3/uL (ref 3.4–10.8)

## 2018-05-19 LAB — COMPREHENSIVE METABOLIC PANEL WITH GFR
ALT: 27 IU/L (ref 0–44)
AST: 26 IU/L (ref 0–40)
Albumin/Globulin Ratio: 1.7 (ref 1.2–2.2)
Albumin: 4.8 g/dL (ref 3.5–5.5)
Alkaline Phosphatase: 114 IU/L (ref 39–117)
BUN/Creatinine Ratio: 13 (ref 9–20)
BUN: 11 mg/dL (ref 6–24)
Bilirubin Total: 0.4 mg/dL (ref 0.0–1.2)
CO2: 23 mmol/L (ref 20–29)
Calcium: 9.9 mg/dL (ref 8.7–10.2)
Chloride: 98 mmol/L (ref 96–106)
Creatinine, Ser: 0.83 mg/dL (ref 0.76–1.27)
GFR calc Af Amer: 116 mL/min/1.73
GFR calc non Af Amer: 100 mL/min/1.73
Globulin, Total: 2.8 g/dL (ref 1.5–4.5)
Glucose: 87 mg/dL (ref 65–99)
Potassium: 4.6 mmol/L (ref 3.5–5.2)
Sodium: 137 mmol/L (ref 134–144)
Total Protein: 7.6 g/dL (ref 6.0–8.5)

## 2018-05-19 LAB — T4 AND TSH
T4, Total: 5.8 ug/dL (ref 4.5–12.0)
TSH: 0.962 u[IU]/mL (ref 0.450–4.500)

## 2018-05-20 ENCOUNTER — Encounter: Payer: Self-pay | Admitting: Pharmacist

## 2018-05-21 ENCOUNTER — Encounter: Payer: Self-pay | Admitting: Pharmacist

## 2018-05-23 ENCOUNTER — Telehealth: Payer: Self-pay | Admitting: *Deleted

## 2018-05-23 NOTE — Telephone Encounter (Signed)
Notes recorded by Antony Blackbird, MD on 05/21/2018 at 6:11 PM EST Left message on voicemail to return call on voicemail.   Please notify patient that his complete metabolic panel was normal as well as normal thyroid test. Patient did have some abnormalities on CBC including hemoglobin of 12.9 with normal being 13-17. Patient may need recheck of this lab at his next visit but he should call or return sooner if he is having any issues with blood in the stool/black sticky stools or abdominal pain.

## 2018-05-25 ENCOUNTER — Telehealth (INDEPENDENT_AMBULATORY_CARE_PROVIDER_SITE_OTHER): Payer: Self-pay

## 2018-05-25 ENCOUNTER — Encounter: Payer: Self-pay | Admitting: Pharmacist

## 2018-05-25 NOTE — Telephone Encounter (Signed)
-----   Message from Antony Blackbird, MD sent at 05/21/2018  6:11 PM EST ----- Please notify patient that his complete metabolic panel was normal as well as normal thyroid test.  Patient did have some abnormalities on CBC including hemoglobin of 12.9 with normal being 13-17.  Patient may need recheck of this lab at his next visit but he should call or return sooner if he is having any issues with blood in the stool/black sticky stools or abdominal pain.

## 2018-05-25 NOTE — Telephone Encounter (Signed)
Left message asking patient to return call to [facility] at [phone].  S , CMA  

## 2018-05-29 ENCOUNTER — Ambulatory Visit: Payer: Self-pay | Attending: Family Medicine | Admitting: Pharmacist

## 2018-05-29 ENCOUNTER — Encounter: Payer: Self-pay | Admitting: Pharmacist

## 2018-05-29 VITALS — BP 133/90 | HR 98

## 2018-05-29 DIAGNOSIS — I10 Essential (primary) hypertension: Secondary | ICD-10-CM

## 2018-05-29 DIAGNOSIS — Z79899 Other long term (current) drug therapy: Secondary | ICD-10-CM | POA: Insufficient documentation

## 2018-05-29 MED ORDER — CHLORTHALIDONE 25 MG PO TABS: 12.5000 mg | ORAL_TABLET | Freq: Every day | ORAL | 2 refills | Status: DC

## 2018-05-29 NOTE — Progress Notes (Signed)
   S:   PCP: Dr. Chapman Fitch   Patient arrives in good spirits. Presents to the clinic for hypertension management. Patient was referred by Dr. Chapman Fitch on 05/18/18. BP 165/99 at that visit. No changes were made to medications.  Today, pt denies chest pain, shortness of breath, headache, or blurred vision. Denies BLE edema.   Patient reports adherence with medications.  Current BP Medications include:  amlodipine 10 mg  Antihypertensives tried in the past include: amlodipine 5 mg   Dietary habits include: limits salt, limits caffeine Exercise habits include: most comes from work Family / Social history: HTN (mother), former smoker, 1-2 beers/day  Home BP readings: denies taking at home  O:  L arm after 5 minutes rest: 133/90, HR 98  Last 3 Office BP readings: BP Readings from Last 3 Encounters:  05/18/18 (!) 165/99  04/17/18 (!) 136/94  04/08/18 (!) 157/94   BMET    Component Value Date/Time   NA 137 05/18/2018 1155   K 4.6 05/18/2018 1155   CL 98 05/18/2018 1155   CO2 23 05/18/2018 1155   GLUCOSE 87 05/18/2018 1155   GLUCOSE 110 (H) 02/05/2018 1324   BUN 11 05/18/2018 1155   CREATININE 0.83 05/18/2018 1155   CREATININE 0.90 10/29/2014 1157   CALCIUM 9.9 05/18/2018 1155   GFRNONAA 100 05/18/2018 1155   GFRNONAA >89 10/29/2014 1157   GFRAA 116 05/18/2018 1155   GFRAA >89 10/29/2014 1157    Renal function: Estimated Creatinine Clearance: 117.5 mL/min (by C-G formula based on SCr of 0.83 mg/dL).  A/P: Hypertension longstanding currently uncontrolled on current medications. BP Goal <130/80 mmHg. Patient is adherent with current medications.  -Started chlorthalidone 12.5 mg daily. (take 1/2 of the 25 mg tablet) -Continued amlodipine 10 mg daily -F/u labs ordered - lipid -Counseled on lifestyle modifications for blood pressure control including reduced dietary sodium, increased exercise, adequate sleep  Results reviewed and written information provided. Total time in  face-to-face counseling 15 minutes.   F/U Clinic Visit w/ PCP 06/15/18.    Benard [place], PharmD, [place] [phone]

## 2018-05-29 NOTE — Patient Instructions (Signed)
Thank you for coming to see [place] today.   Blood pressure today is improving    Take 1/2 tablet of chlorthalidone daily in the morning.   Take 1 tablet of amlodipine at night before bed.   Limiting salt and caffeine, as well as exercising as able for at least 30 minutes for 5 days out of the week, can also help you lower your blood pressure.  Take your blood pressure at home if you are able. Please write down these numbers and bring them to your visits.  If you have any questions about medications, please call me [phone].  Lurena Joiner

## 2018-05-30 LAB — LIPID PANEL
Chol/HDL Ratio: 1.8 ratio (ref 0.0–5.0)
Cholesterol, Total: 164 mg/dL (ref 100–199)
HDL: 90 mg/dL
LDL Calculated: 54 mg/dL (ref 0–99)
Triglycerides: 98 mg/dL (ref 0–149)
VLDL Cholesterol Cal: 20 mg/dL (ref 5–40)

## 2018-06-01 ENCOUNTER — Telehealth: Payer: Self-pay | Admitting: *Deleted

## 2018-06-01 NOTE — Telephone Encounter (Signed)
-----   Message from Antony Blackbird, MD sent at 05/30/2018  7:05 PM EST ----- Please notify patient that his lipid panel is normal.  Continue a healthy diet

## 2018-06-01 NOTE — Telephone Encounter (Signed)
Patient verified DOB Patient is aware of cholesterol being normal and he will pick up his medication on Monday. No further questions.

## 2018-06-12 MED FILL — AMLODIPINE BESYLATE 10 MG T: 10 | 30 days supply | Qty: 30 | Fill #1

## 2018-06-12 MED FILL — GABAPENTIN 300 MG CAPSULE: 300 | 30 days supply | Qty: 30 | Fill #1

## 2018-06-12 MED FILL — CHLORTHALIDONE 25 MG TAB: 25 | 30 days supply | Qty: 15 | Fill #0

## 2018-06-15 ENCOUNTER — Ambulatory Visit: Payer: Self-pay | Admitting: Family Medicine

## 2018-06-19 ENCOUNTER — Other Ambulatory Visit: Payer: Self-pay | Admitting: Urology

## 2018-06-21 ENCOUNTER — Encounter (HOSPITAL_COMMUNITY): Payer: Self-pay | Admitting: *Deleted

## 2018-06-21 ENCOUNTER — Ambulatory Visit: Payer: Self-pay | Attending: Family Medicine | Admitting: Family Medicine

## 2018-06-21 ENCOUNTER — Encounter: Payer: Self-pay | Admitting: Family Medicine

## 2018-06-21 VITALS — BP 113/79 | HR 78 | Temp 98.0°F | Resp 18 | Ht 69.0 in | Wt 206.0 lb

## 2018-06-21 DIAGNOSIS — Z79899 Other long term (current) drug therapy: Secondary | ICD-10-CM | POA: Insufficient documentation

## 2018-06-21 DIAGNOSIS — Z8249 Family history of ischemic heart disease and other diseases of the circulatory system: Secondary | ICD-10-CM | POA: Insufficient documentation

## 2018-06-21 DIAGNOSIS — Z791 Long term (current) use of non-steroidal anti-inflammatories (NSAID): Secondary | ICD-10-CM | POA: Insufficient documentation

## 2018-06-21 DIAGNOSIS — Z87891 Personal history of nicotine dependence: Secondary | ICD-10-CM | POA: Insufficient documentation

## 2018-06-21 DIAGNOSIS — Z886 Allergy status to analgesic agent status: Secondary | ICD-10-CM | POA: Insufficient documentation

## 2018-06-21 DIAGNOSIS — I10 Essential (primary) hypertension: Secondary | ICD-10-CM

## 2018-06-21 MED ORDER — AMLODIPINE BESYLATE 10 MG PO TABS: 10.0000 mg | ORAL_TABLET | Freq: Every day | ORAL | 4 refills | Status: DC

## 2018-06-21 MED ORDER — CHLORTHALIDONE 25 MG PO TABS: 12.5000 mg | ORAL_TABLET | Freq: Every day | ORAL | 4 refills | Status: DC

## 2018-06-21 NOTE — Progress Notes (Signed)
Subjective:    Patient ID: Darren Larsen, male    DOB: 23-May-1966, 52 y.o.   MRN: [id number]  HPI       52 year old male who is seen in follow-up of hypertension.  Patient states that he has been scheduled for surgery for left-sided spermatocele which has been causing him to have pain in his left groin area.  Patient did have preoperative examination and EKG done here at the office previously but patient's blood pressure was elevated.  Patient had blood pressure recheck earlier this week which continued to be elevated, patient admits that he had not taken his blood pressure medication prior to his blood pressure recheck.  Patient states that he has taken his blood pressure medicine before today's visit and is taking his blood pressure medication regularly.  Patient denies any headaches or dizziness related to his blood pressure.  Patient states that he would really like to have his surgery done as he has had chronic pain related to the left-sided spermatocele.  At today's visit, patient states that he is also had recurrent issues with pain in the left lower extremity from the knee down.  Patient however states it is first priority is to proceed with surgical correction of the spermatocele.  Patient denies any recent fever or chills, no cough or shortness of breath.  Patient denies any chest pain or palpitations.  Patient has had no headaches or dizziness.  Patient denies any peripheral edema.  Patient states that overall he feels well other than his issues with groin pain and pain in his left leg.  Past Medical History:  Diagnosis Date  . Anxiety   . Arthritis    hands  . History of concussion    per pt concussion without LOC 1980s -- no residuals  . History of seizure    06-21-2018  per pt was told over 30 yrs ago had a seizure while sleeping  . Hypertension   . Retinitis pigmentosa congenital   . Spermatocele    left  . Wears glasses    Past Surgical History:  Procedure Laterality Date  .  INGUINAL HERNIA REPAIR Left infant;  age 21;  58s   Family History  Problem Relation Age of Onset  . Hypertension Mother   . Social History   Tobacco Use  . Smoking status: Former Smoker    Packs/day: 0.50    Years: 20.00    Pack years: 10.00    Types: Cigarettes    Last attempt to quit: 06/14/2018    Years since quitting: 0.0  . Smokeless tobacco: Never Used  . Tobacco comment: recently stopped smoking approx. 06-14-2018  Substance Use Topics  . Alcohol use: Yes    Comment: per pt average 32 oz beer per day  . Drug use: Yes    Types: Marijuana    Comment: 06-21-2018 last marijuana per pt 3 wks ago   Allergies  Allergen Reactions  . Aspirin Other (See Comments)    NOSE BLEED   Current Outpatient Medications on File Prior to Visit  Medication Sig Dispense Refill  . acetaminophen (TYLENOL) 500 MG tablet Take 1 tablet (500 mg total) by mouth every 6 (six) hours as needed. 30 tablet 0  . gabapentin (NEURONTIN) 300 MG capsule Take 1 capsule (300 mg total) by mouth at bedtime. 30 capsule 4  . ibuprofen (ADVIL,MOTRIN) 200 MG tablet Take 400 mg by mouth every 6 (six) hours as needed for headache or moderate pain.    . methocarbamol (  ROBAXIN) 500 MG tablet Take 1 tablet (500 mg total) by mouth 2 (two) times daily. As needed for muscle spasm 60 tablet 6  . thiamine (VITAMIN B-1) 100 MG tablet Take 1 tablet (100 mg total) by mouth daily. (Patient not taking: Reported on 06/21/2018) 30 tablet 6  . diclofenac sodium (VOLTAREN) 1 % GEL appply 4 gm to shoulder area up to 4 times per day as needed for pain (Patient not taking: Reported on 06/21/2018) 2 Tube 4  . Omega-3 1000 MG CAPS Take 2 capsules by mouth daily.    Marland Kitchen Ophthalmic Irrigation Solution (EYE [place] OP) Apply to eye as needed.     No current facility-administered medications on file prior to visit.      Review of Systems  Constitutional: Positive for fatigue. Negative for chills and fever.  HENT: Negative for sore throat and  trouble swallowing.   Eyes: Positive for visual disturbance. Negative for photophobia.  Respiratory: Negative for cough and shortness of breath.   Cardiovascular: Negative for chest pain, palpitations and leg swelling.  Gastrointestinal: Negative for abdominal pain, blood in stool, constipation, diarrhea and nausea.  Genitourinary: Negative for dysuria, flank pain, frequency and hematuria.  Musculoskeletal: Positive for arthralgias. Negative for back pain.  Neurological: Negative for dizziness and headaches.  Hematological: Negative for adenopathy. Does not bruise/bleed easily.       Objective:   Physical Exam BP 113/79 (BP Location: Left Arm, Patient Position: Sitting, Cuff Size: Normal)   Pulse 78   Temp 98 F (36.7 C) (Oral)   Resp 18   Ht 5\' 9"  (1.753 m)   Wt 206 lb (93.4 kg)   SpO2 98%   BMI 30.42 kg/m  Vital signs and nurse's notes reviewed General-well-nourished, well-developed male who appears slightly older than his stated age who is in no acute distress.  Patient is accompanied by his wife at today's visit Neck-supple, no lymphadenopathy, no thyromegaly, no carotid bruit Lungs-clear to auscultation bilaterally Cardiovascular-regular rate and rhythm Abdomen-soft, nontender Back-no CVA tenderness Extremities-no edema        Assessment & Plan:  1. Essential hypertension Patient will continue the use of amlodipine 10 mg once daily as well as chlorthalidone 12.5 mg once daily.  Patient is to continue a Dash type diet.  Importance of daily compliance with blood pressure medication was stressed.  Patient's blood pressure is controlled to today's visit and patient is considered cleared for upcoming surgery but was made aware that all surgeries to impose risk of morbidity and mortality.  After recovery from upcoming surgery, patient was asked to make follow-up appointment regarding his blood pressure as well as his complaint of left-sided pain from the knee down which he  believes is secondary to remote injury.  Patient also with radicular pain.  Form will be faxed to urology regarding surgical clearance. - amLODipine (NORVASC) 10 MG tablet; Take 1 tablet (10 mg total) by mouth daily. (Patient taking differently: Take 10 mg by mouth 2 (two) times daily. )  Dispense: 30 tablet; Refill: 4 - chlorthalidone (HYGROTON) 25 MG tablet; Take 0.5 tablets (12.5 mg total) by mouth daily. (Patient taking differently: Take 12.5 mg by mouth every morning. )  Dispense: 15 tablet; Refill: 4  An After Visit Summary was printed and given to the patient.  Return in about 8 weeks (around 08/16/2018) for left leg pain/HTN.

## 2018-06-21 NOTE — Progress Notes (Signed)
Spoke w/ pt via phone for pre-op interview.  Pt verbalized understanding npo after mn and arrive at 0645 at [facility] main hospital and report to admitting.  Will take norvasc am dos w/ sips of water.  Current EKG dated 05-18-2018 in epic.  Pt pcp surgical clearance , dr Chapman Fitch,  Dated 05-18-2018 in epic.

## 2018-06-26 ENCOUNTER — Other Ambulatory Visit: Payer: Self-pay

## 2018-06-26 ENCOUNTER — Encounter (HOSPITAL_COMMUNITY): Payer: Self-pay | Admitting: *Deleted

## 2018-06-26 ENCOUNTER — Encounter (HOSPITAL_COMMUNITY)
Admission: RE | Disposition: A | Discharge status: Home / Self Care | Payer: Self-pay | Source: Home / Self Care | Attending: Urology

## 2018-06-26 ENCOUNTER — Ambulatory Visit (HOSPITAL_COMMUNITY): Payer: Self-pay | Admitting: Anesthesiology

## 2018-06-26 ENCOUNTER — Ambulatory Visit (HOSPITAL_COMMUNITY)
Admission: RE | Admit: 2018-06-26 | Discharge: 2018-06-26 | Disposition: A | Discharge status: Home / Self Care | Payer: Self-pay | Attending: Urology | Admitting: Urology

## 2018-06-26 DIAGNOSIS — G8929 Other chronic pain: Secondary | ICD-10-CM | POA: Insufficient documentation

## 2018-06-26 DIAGNOSIS — M25552 Pain in left hip: Secondary | ICD-10-CM | POA: Insufficient documentation

## 2018-06-26 DIAGNOSIS — N4341 Spermatocele of epididymis, single: Secondary | ICD-10-CM | POA: Insufficient documentation

## 2018-06-26 DIAGNOSIS — I10 Essential (primary) hypertension: Secondary | ICD-10-CM | POA: Insufficient documentation

## 2018-06-26 DIAGNOSIS — Z87891 Personal history of nicotine dependence: Secondary | ICD-10-CM | POA: Insufficient documentation

## 2018-06-26 HISTORY — PX: SPERMATOCELECTOMY: SHX2420

## 2018-06-26 HISTORY — DX: Presence of spectacles and contact lenses: Z97.3

## 2018-06-26 HISTORY — DX: Personal history of other specified conditions: Z87.898

## 2018-06-26 HISTORY — DX: Spermatocele of epididymis, unspecified: N43.40

## 2018-06-26 HISTORY — DX: Anxiety disorder, unspecified: F41.9

## 2018-06-26 HISTORY — DX: Unspecified osteoarthritis, unspecified site: M19.90

## 2018-06-26 HISTORY — DX: Personal history of traumatic brain injury: Z87.820

## 2018-06-26 LAB — CBC
HCT: 38.7 % — ABNORMAL LOW (ref 39.0–52.0)
Hemoglobin: 12.1 g/dL — ABNORMAL LOW (ref 13.0–17.0)
MCH: 23.7 pg — AB (ref 26.0–34.0)
MCHC: 31.3 g/dL (ref 30.0–36.0)
MCV: 75.9 fL — AB (ref 80.0–100.0)
PLATELETS: 234 10*3/uL (ref 150–400)
RBC: 5.1 MIL/uL (ref 4.22–5.81)
RDW: 15.7 % — AB (ref 11.5–15.5)
WBC: 7.4 10*3/uL (ref 4.0–10.5)
nRBC: 0 % (ref 0.0–0.2)

## 2018-06-26 LAB — BASIC METABOLIC PANEL
Anion gap: 7 (ref 5–15)
BUN: 12 mg/dL (ref 6–20)
CALCIUM: 9.2 mg/dL (ref 8.9–10.3)
CO2: 27 mmol/L (ref 22–32)
Chloride: 104 mmol/L (ref 98–111)
Creatinine, Ser: 0.86 mg/dL (ref 0.61–1.24)
GFR calc Af Amer: >60 mL/min (ref >60)
GLUCOSE: 91 mg/dL (ref 70–99)
Potassium: 3.6 mmol/L (ref 3.5–5.1)
Sodium: 138 mmol/L (ref 135–145)

## 2018-06-26 SURGERY — EXCISION, SPERMATOCELE
Anesthesia: General | Laterality: Left

## 2018-06-26 MED ORDER — LIDOCAINE HCL (CARDIAC) PF 100 MG/5ML IV SOSY
PREFILLED_SYRINGE | INTRAVENOUS | Status: DC | PRN
  Administered 2018-06-26: 100 mg via INTRAVENOUS

## 2018-06-26 MED ORDER — OXYCODONE HCL 5 MG PO TABS
5.0000 mg | ORAL_TABLET | Freq: Once | ORAL | Status: AC | PRN
  Administered 2018-06-26: 5 mg via ORAL

## 2018-06-26 MED ORDER — SCOPOLAMINE 1 MG/3DAYS TD PT72
1.0000 | MEDICATED_PATCH | TRANSDERMAL | Status: DC
  Administered 2018-06-26: 1.5 mg via TRANSDERMAL
  Filled 2018-06-26: qty 1

## 2018-06-26 MED ORDER — MIDAZOLAM HCL 2 MG/2ML IJ SOLN
INTRAMUSCULAR | Status: AC
  Filled 2018-06-26: qty 2

## 2018-06-26 MED ORDER — PROPOFOL 10 MG/ML IV BOLUS
INTRAVENOUS | Status: DC | PRN
  Administered 2018-06-26: 200 mg via INTRAVENOUS

## 2018-06-26 MED ORDER — BUPIVACAINE HCL (PF) 0.25 % IJ SOLN
INTRAMUSCULAR | Status: AC
  Filled 2018-06-26: qty 30

## 2018-06-26 MED ORDER — LACTATED RINGERS IV SOLN
INTRAVENOUS | Status: DC
  Administered 2018-06-26: 08:00:00 via INTRAVENOUS

## 2018-06-26 MED ORDER — MIDAZOLAM HCL 5 MG/5ML IJ SOLN
INTRAMUSCULAR | Status: DC | PRN
  Administered 2018-06-26: 2 mg via INTRAVENOUS

## 2018-06-26 MED ORDER — OXYCODONE HCL 5 MG PO TABS: 5.0000 mg | ORAL_TABLET | Freq: Four times a day (QID) | ORAL | 0 refills | Status: AC | PRN

## 2018-06-26 MED ORDER — ONDANSETRON HCL 4 MG/2ML IJ SOLN
INTRAMUSCULAR | Status: DC | PRN
  Administered 2018-06-26: 4 mg via INTRAVENOUS

## 2018-06-26 MED ORDER — BUPIVACAINE HCL 0.25 % IJ SOLN
INTRAMUSCULAR | Status: DC | PRN
  Administered 2018-06-26: 10 mL

## 2018-06-26 MED ORDER — DEXAMETHASONE SODIUM PHOSPHATE 10 MG/ML IJ SOLN
INTRAMUSCULAR | Status: DC | PRN
  Administered 2018-06-26: 5 mg via INTRAVENOUS

## 2018-06-26 MED ORDER — 0.9 % SODIUM CHLORIDE (POUR BTL) OPTIME
TOPICAL | Status: DC | PRN
  Administered 2018-06-26: 1000 mL

## 2018-06-26 MED ORDER — FENTANYL CITRATE (PF) 100 MCG/2ML IJ SOLN
INTRAMUSCULAR | Status: AC
  Filled 2018-06-26: qty 2

## 2018-06-26 MED ORDER — DEXAMETHASONE SODIUM PHOSPHATE 10 MG/ML IJ SOLN
INTRAMUSCULAR | Status: AC
  Filled 2018-06-26: qty 1

## 2018-06-26 MED ORDER — CEFAZOLIN SODIUM-DEXTROSE 2-4 GM/100ML-% IV SOLN
2.0000 g | Freq: Once | INTRAVENOUS | Status: AC
  Administered 2018-06-26: 2 g via INTRAVENOUS
  Filled 2018-06-26: qty 100

## 2018-06-26 MED ORDER — LIDOCAINE 2% (20 MG/ML) 5 ML SYRINGE
INTRAMUSCULAR | Status: AC
  Filled 2018-06-26: qty 5

## 2018-06-26 MED ORDER — PROMETHAZINE HCL 25 MG/ML IJ SOLN: 6.2500 mg | INTRAMUSCULAR | Status: DC | PRN

## 2018-06-26 MED ORDER — OXYCODONE HCL 5 MG PO TABS
ORAL_TABLET | ORAL | Status: AC
  Filled 2018-06-26: qty 1

## 2018-06-26 MED ORDER — BACITRACIN ZINC 500 UNIT/GM EX OINT
TOPICAL_OINTMENT | CUTANEOUS | Status: AC
  Filled 2018-06-26: qty 28.35

## 2018-06-26 MED ORDER — PROPOFOL 10 MG/ML IV BOLUS
INTRAVENOUS | Status: AC
  Filled 2018-06-26: qty 20

## 2018-06-26 MED ORDER — FENTANYL CITRATE (PF) 100 MCG/2ML IJ SOLN
INTRAMUSCULAR | Status: DC | PRN
  Administered 2018-06-26: 100 ug via INTRAVENOUS
  Administered 2018-06-26 (×2): 50 ug via INTRAVENOUS

## 2018-06-26 MED ORDER — FENTANYL CITRATE (PF) 100 MCG/2ML IJ SOLN
25.0000 ug | INTRAMUSCULAR | Status: DC | PRN
  Administered 2018-06-26: 25 ug via INTRAVENOUS

## 2018-06-26 MED ORDER — ONDANSETRON HCL 4 MG/2ML IJ SOLN
INTRAMUSCULAR | Status: AC
  Filled 2018-06-26: qty 2

## 2018-06-26 SURGICAL SUPPLY — 26 items
BLADE HEX COATED 2.75 (ELECTRODE) ×3 IMPLANT
BNDG GAUZE ELAST 4 BULKY (GAUZE/BANDAGES/DRESSINGS) ×3 IMPLANT
COVER SURGICAL LIGHT HANDLE (MISCELLANEOUS) ×3 IMPLANT
COVER WAND RF STERILE (DRAPES) ×3 IMPLANT
DRAIN PENROSE 18X1/4 LTX STRL (WOUND CARE) ×3 IMPLANT
DRAPE LAPAROTOMY T 98X78 PEDS (DRAPES) ×3 IMPLANT
DRSG KUZMA FLUFF (GAUZE/BANDAGES/DRESSINGS) ×3 IMPLANT
ELECT NEEDLE TIP 2.8 STRL (NEEDLE) ×3 IMPLANT
ELECT REM PT RETURN 15FT ADLT (MISCELLANEOUS) ×3 IMPLANT
GLOVE BIOGEL M STRL SZ7.5 (GLOVE) ×3 IMPLANT
GOWN STRL REUS W/TWL LRG LVL3 (GOWN DISPOSABLE) ×3 IMPLANT
KIT BASIN OR (CUSTOM PROCEDURE TRAY) ×3 IMPLANT
NEEDLE HYPO 22GX1.5 SAFETY (NEEDLE) IMPLANT
NS IRRIG 1000ML POUR BTL (IV SOLUTION) ×3 IMPLANT
PACK GENERAL/GYN (CUSTOM PROCEDURE TRAY) ×3 IMPLANT
SUPPORT SCROTAL LG STRP (MISCELLANEOUS) ×2 IMPLANT
SUPPORTER ATHLETIC LG (MISCELLANEOUS) ×1
SUT CHROMIC 3 0 SH 27 (SUTURE) ×9 IMPLANT
SUT MON AB 3-0 SH 27 (SUTURE) ×2
SUT MON AB 3-0 SH27 (SUTURE) ×1 IMPLANT
SUT SILK 0 SH 30 (SUTURE) ×3 IMPLANT
SUT VIC AB 2-0 UR5 27 (SUTURE) IMPLANT
SUT VICRYL 0 TIES 12 18 (SUTURE) IMPLANT
SYR CONTROL 10ML LL (SYRINGE) IMPLANT
TOWEL OR 17X26 10 PK STRL BLUE (TOWEL DISPOSABLE) ×6 IMPLANT
WATER STERILE IRR 1000ML POUR (IV SOLUTION) ×3 IMPLANT

## 2018-06-26 NOTE — Transfer of Care (Signed)
Immediate Anesthesia Transfer of Care Note  Patient: Darren Larsen  Procedure(s) Performed: SPERMATOCELECTOMY (Left )  Patient Location: PACU  Anesthesia Type:General  Level of Consciousness: awake, alert  and oriented  Airway & Oxygen Therapy: Patient Spontanous Breathing and Patient connected to face mask oxygen  Post-op Assessment: Report given to RN and Post -op Vital signs reviewed and stable  Post vital signs: Reviewed and stable  Last Vitals:  Vitals Value Taken Time  BP    Temp    Pulse 83 06/26/2018 11:49 AM  Resp    SpO2 98 % 06/26/2018 11:49 AM  Vitals shown include unvalidated device data.  Last Pain:  Vitals:   06/26/18 0740  TempSrc:   PainSc: 10-Worst pain ever         Complications: No apparent anesthesia complications

## 2018-06-26 NOTE — Anesthesia Postprocedure Evaluation (Signed)
Anesthesia Post Note  Patient: Darren Larsen  Procedure(s) Performed: SPERMATOCELECTOMY (Left )     Patient location during evaluation: PACU Anesthesia Type: General Level of consciousness: sedated Pain management: pain level controlled Vital Signs Assessment: post-procedure vital signs reviewed and stable Respiratory status: spontaneous breathing and respiratory function stable Cardiovascular status: stable Postop Assessment: no apparent nausea or vomiting Anesthetic complications: no    Last Vitals:  Vitals:   06/26/18 1245 06/26/18 1255  BP: (!) 124/91 (!) 120/97  Pulse: 72 73  Resp: 12 16  Temp:  36.6 C  SpO2: 94% 94%    Last Pain:  Vitals:   06/26/18 1255  TempSrc:   PainSc: [place]

## 2018-06-26 NOTE — Anesthesia Preprocedure Evaluation (Addendum)
Anesthesia Evaluation  Patient identified by MRN, date of birth, ID band Patient awake    Reviewed: Allergy & Precautions, NPO status , Patient's Chart, lab work & pertinent test results  History of Anesthesia Complications Negative for: history of anesthetic complications  Airway Mallampati: II  TM Distance: >3 FB Neck ROM: Full    Dental no notable dental hx. (+) Dental Advisory Given   Pulmonary former smoker,    Pulmonary exam normal        Cardiovascular hypertension, Pt. on medications Normal cardiovascular exam     Neuro/Psych PSYCHIATRIC DISORDERS Anxiety negative neurological ROS     GI/Hepatic negative GI ROS, Neg liver ROS,   Endo/Other  negative endocrine ROS  Renal/GU negative Renal ROS     Musculoskeletal negative musculoskeletal ROS (+)   Abdominal   Peds  Hematology negative hematology ROS (+)   Anesthesia Other Findings Day of surgery medications reviewed with the patient.  Reproductive/Obstetrics                            Anesthesia Physical Anesthesia Plan  ASA: II  Anesthesia Plan: General   Post-op Pain Management:    Induction: Intravenous  PONV Risk Score and Plan: 3 and Ondansetron, Dexamethasone and Scopolamine patch - Pre-op  Airway Management Planned: LMA  Additional Equipment:   Intra-op Plan:   Post-operative Plan: Extubation in OR  Informed Consent: I have reviewed the patients History and Physical, chart, labs and discussed the procedure including the risks, benefits and alternatives for the proposed anesthesia with the patient or authorized representative who has indicated his/her understanding and acceptance.   Dental advisory given  Plan Discussed with: CRNA and Anesthesiologist  Anesthesia Plan Comments:        Anesthesia Quick Evaluation

## 2018-06-26 NOTE — H&P (Signed)
Urology H+P Note   Attending: Lovena Neighbours, MD  Chief Complaint:  Spermatocele  HPI: Darren Larsen is a 52 y.o. 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  He was evaluated in the urology clinic previously and management options discussed. He elected to proceed with spermatocelectomy.  Today, he is feeling well without additional complaints. No recent fevers, nausea, vomiting, chest pain, shortness of breath.   He does report a history of intermittent chest discomfort which causes shortness of breath. This occurs at work. He received PCP clearance.   He has not had injuries to the testicles or scrotum. He has not had a testicular infection. He has had the following surgeries: Hernia Repair. The patient denies having the following scrotal surgeries: Vasectomy, Hydrocele Repair, Undescended Testis Surgery, and Varicocele Repair. He has not had a vasectomy. He has had a hernia repaired on the same side as the spermatocele. His spermatocele does bother him enough to consider surgical repair.   Patient reports significant discomfort from his spermatocele, resulting in altered gait and difficulty working as a Games developer.   No history of scrotal infections. No UTIs. Denies fevers, chills, nausea vomiting.   He is not interested in further fertility.   There are no aggravating or alleviating factors.  The spermatocele and discomfort are stable.   He does not clinically appear infected today.     Past Medical History: Past Medical History:  Diagnosis Date  . Anxiety   . Arthritis    hands  . History of concussion    per pt concussion without LOC 1980s -- no residuals  . History of seizure    06-21-2018  per pt was told over 30 yrs ago had a seizure while sleeping  . Hypertension   . Retinitis pigmentosa congenital   . Spermatocele    left  . Wears glasses     Past Surgical History:  Past Surgical History:  Procedure Laterality Date  .  INGUINAL HERNIA REPAIR Left infant;  age 22;  5s    Medication: Current Facility-Administered Medications  Medication Dose Route Frequency Provider Last Rate Last Dose  . ceFAZolin (ANCEF) IVPB 2g/100 mL premix  2 g Intravenous Once Winter, Christopher Aaron, MD        Allergies: Allergies  Allergen Reactions  . Aspirin Other (See Comments)    NOSE BLEED    Social History: Social History   Tobacco Use  . Smoking status: Former Smoker    Packs/day: 0.50    Years: 20.00    Pack years: 10.00    Types: Cigarettes    Last attempt to quit: 06/14/2018    Years since quitting: 0.0  . Smokeless tobacco: Never Used  . Tobacco comment: recently stopped smoking approx. 06-14-2018  Substance Use Topics  . Alcohol use: Yes    Comment: per pt average 32 oz beer per day  . Drug use: Yes    Types: Marijuana    Comment: 06-21-2018 last marijuana per pt 3 wks ago    Family History Family History  Problem Relation Age of Onset  . Hypertension Mother     Review of Systems 10 systems were reviewed and are negative except as noted specifically in the HPI.  Objective   Vital signs in last 24 hours: BP (!) 132/92   Pulse 90   Temp 98.3 F (36.8 C) (Oral)   Resp 18   Ht 5\' 9"  (1.753 m)   Wt 94.1 kg   SpO2 97%  BMI 30.64 kg/m   Physical Exam General: NAD, A&O, resting, appropriate HEENT: [place]/AT, EOMI, MMM Pulmonary: Normal work of breathing Cardiovascular: HDS, adequate peripheral perfusion Abdomen: Soft, NTTP, nondistended. GU: Large left sided scrotal swelling and mild tenderness to palpation. No erythema or evidence of infection on examination. Inability to palpate left testicle due to fluid collection occupying much of the scrotum. Right testicle normal size and consistency. No masses or nodules.  Extremities: warm and well perfused Neuro: Appropriate, no focal neurological deficits  Most Recent Labs: Lab Results  Component Value Date   WBC 7.9 05/18/2018   HGB  12.9 (L) 05/18/2018   HCT 40.9 05/18/2018   PLT 205 05/18/2018    Lab Results  Component Value Date   NA 137 05/18/2018   K 4.6 05/18/2018   CL 98 05/18/2018   CO2 23 05/18/2018   BUN 11 05/18/2018   CREATININE 0.83 05/18/2018   CALCIUM 9.9 05/18/2018    No results found for: INR, APTT   IMAGING: No results found.  ------  Assessment:  52 y.o. male with 10.6 cm left spermatocele scheduled for left spermatocelectomy. He presents and would like to proceed today.  Patient's exam and scrotal ultrasound are consistent with large left spermatocele. Previously, we discussed the etiology and management of spermatoceles, including the risks of benefits of each approach. These management options include supportive care (scrotal support, heat therapy, NSAIDs, regular testicular self-examinations) vs. aspiration with or without sclerotherapy vs. spermatocelectomy. For cyst aspiration with or without sclerotherapy, risks include infection, damage to surrounding organs, a high rate of recurrence, and chemical epididymitis. Given the high rate of recurrence, risk of chemical epididymitis, and size of his spermatocele, do not recommend this approach. For spermatocelectomy, risks discussed included bleeding, infection, damage to surrounding organs and blood vessels, including orchalgia, testicular atrophy if the testicular vessels are injured, and epidydimal obstruction resulting in possible infertility.   Given size of spermatocele and that it is bothersome to patient, he would like to proceed with spermatocelectomy.   Additionally, we discussed plan to leave a drain post-operatively and need for follow-up 3 days post-operatively for drain removal. No heavy lifting for 2 weeks post-operatively; patient in agreement.   The patient also reports chronic left hip pain and we discussed that this surgery may not improve his hip pain.    Plan: - Pending anesthesia evaluation, proceed to OR today for  left spermatocelectomy. - Plan for discharge home post-operatively.

## 2018-06-26 NOTE — Op Note (Signed)
Patient: Darren Larsen  Preoperative Diagnosis: Left spermatocele  Postoperative Diagnosis: Same  Procedure(s) Performed:  SPERMATOCELECTOMY  Surgeon:  Surgeon(s): Ceasar Mons, MD  Resident Surgeon: Dorothey Baseman, MD  Assistant(s): None  Anesthesia:  Choice  Fluids:  See anesthesia record  Estimated blood loss: 10 mL  Specimens:  ID Type Source Tests Collected by Time Destination  1 : SPERMATOCELE SAC,LEFT Tissue Soft Tissue, Other SURGICAL PATHOLOGY Ceasar Mons, MD 06/26/2018 1123     Drains: 1/4 inch penrose   Complications:  None  Indications:  This is a 52 y.o. patient with a history of a large left-sided spermatocele with discomfort and limitation of activities due to size. After discussion of the risks & benefits and alternatives to surgical approach, the patient wishes to proceed with left spermatocelectomy. Risks & benefits of the procedure discussed with the patient, who wishes to proceed.   Findings: Left spermatocele drained of 350 mL of fluid and excised.   Procedure:  Once informed consent was obtained, the patient was brought to the operating room and placed in a supine position. A pre-opative time out was performed to confirm the patient's identity as well as the procedure to be performed and side of the procedure. The site of the procedure was marked prior to the operation. Pre-operative antibiotics were administered. The patient's scrotum was clipped and then prepped and draped in the standard fashion with Chloraprep.   A midline incision along the median raphe was delineated using a marking pen. A #15 blade was then used to incise along the scrotal incision and electrocautery was used to carry the incision down through dartos fascia. This allowed delivery of the hemiscrotal contents with the tunica vaginalis intact. A simple spermatocele was identified extending up from the superiorposterior portion of the testis. The  spermatocele was incised and drained and the spermatocele sac was excised. Hemostasis was excellent. Electrocautery was then used on Dartos fascia to ensure hemostasis. I then reapproximated the edges behind the epididymis with a running 3-0 chromic with intermittent locking. The testis was then replaced within the hemiscrotum in its normal anatomic position. We carefully inspected to ensure that the spermatic cord contents and testicle were in their normal anatomic position.  An incision was made in the dependent portion of the scrotum and a 1/4 penrose drain was placed. It was sutured in place using 3-0 chromic suture.   We then closed the dartos layers of the scrotum with a running 3-0 chromic suture and closed the skin with a 4-0 monocryl running suture. The patient was cleaned and dried. Bacitracin was placed over the incision. Fluffs and scrotal support were used as a dressing and mesh underwear were placed over them. The patient tolerated the procedure well without complications.   Plan: 1. Discharge from PACU once meeting PACU discharge criteria. 2. Follow-up in 3 days for drain removal.

## 2018-06-26 NOTE — Anesthesia Procedure Notes (Signed)
Procedure Name: LMA Insertion Date/Time: 06/26/2018 10:28 AM Performed by: Jonna Munro, CRNA Pre-anesthesia Checklist: Patient identified, Emergency Drugs available, Suction available, Patient being monitored and Timeout performed Patient Re-evaluated:Patient Re-evaluated prior to induction Oxygen Delivery Method: Circle system utilized Preoxygenation: Pre-oxygenation with 100% oxygen Induction Type: IV induction LMA: LMA inserted LMA Size: 5.0 Number of attempts: 1 Placement Confirmation: positive ETCO2 and breath sounds checked- equal and bilateral Dental Injury: Teeth and Oropharynx as per pre-operative assessment

## 2018-06-26 NOTE — Discharge Instructions (Signed)
Activity:  You are encouraged to ambulate frequently (about every hour during waking hours) to help prevent blood clots from forming in your legs or lungs.  However, you should not engage in any heavy lifting (> 10-15 lbs), strenuous activity, or straining for 4 weeks. Keep the scrotal support on for 48 hours, and wear tight fitting underwear thereafter for 1 week.   Diet: Regular diet  Prescriptions:  You will be provided a prescription for pain medication to take as needed. You should take tylenol scheduled for the next couple days if you are experiencing pain. Do not exceed 4g of tylenol in 24 hours. You may take oxycodone as needed for breakthrough pain. Ice works very well also.  You should also take an over the counter stool softener (such as senna or colace) to avoid straining with bowel movements as the prescription pain medication may constipate you.  Incisions: Apply bacitracin to the incision for the next couple days. You may start showering (but not soaking or bathing in water) the 2nd day after surgery and the incisions simply need to be patted dry after the shower.  No additional care is needed.  What to call us about: You should call the office [phone]) if you develop fever > 101 or develop persistent vomiting. Call if you have significant bleeding from your incision, or large swelling of the scrotum.   Change the gauze in your underwear a couple times a day or more if needed. The drain will be removed at your follow-up appointment on 12/10.

## 2018-06-27 ENCOUNTER — Encounter (HOSPITAL_COMMUNITY): Payer: Self-pay | Admitting: Urology

## 2018-08-17 ENCOUNTER — Ambulatory Visit: Payer: Self-pay | Admitting: Family Medicine

## 2018-09-11 ENCOUNTER — Encounter: Payer: Self-pay | Admitting: Family Medicine

## 2018-09-11 ENCOUNTER — Ambulatory Visit: Payer: Self-pay | Attending: Family Medicine | Admitting: Family Medicine

## 2018-09-11 VITALS — BP 153/111 | HR 112 | Temp 99.0°F | Ht 69.0 in | Wt 205.0 lb

## 2018-09-11 DIAGNOSIS — M549 Dorsalgia, unspecified: Secondary | ICD-10-CM

## 2018-09-11 DIAGNOSIS — I10 Essential (primary) hypertension: Secondary | ICD-10-CM

## 2018-09-11 DIAGNOSIS — F32A Depression, unspecified: Secondary | ICD-10-CM

## 2018-09-11 DIAGNOSIS — F329 Major depressive disorder, single episode, unspecified: Secondary | ICD-10-CM

## 2018-09-11 DIAGNOSIS — H3552 Pigmentary retinal dystrophy: Secondary | ICD-10-CM

## 2018-09-11 DIAGNOSIS — M24541 Contracture, right hand: Secondary | ICD-10-CM

## 2018-09-11 DIAGNOSIS — M25562 Pain in left knee: Secondary | ICD-10-CM

## 2018-09-11 DIAGNOSIS — G8929 Other chronic pain: Secondary | ICD-10-CM

## 2018-09-11 DIAGNOSIS — F419 Anxiety disorder, unspecified: Secondary | ICD-10-CM

## 2018-09-11 MED FILL — GABAPENTIN 300 MG CAPSULE: 300 | 30 days supply | Qty: 30 | Fill #2

## 2018-09-11 MED FILL — AMLODIPINE BESYLATE 10 MG T: 10 | 30 days supply | Qty: 30 | Fill #2

## 2018-09-11 MED FILL — METHOCARBAMOL 500 MG TABS: 500 | 30 days supply | Qty: 60 | Fill #1

## 2018-09-11 NOTE — Progress Notes (Signed)
Patient complaining of inner thigh pain on lt leg, says that it has been going on for years but got worse since surgery on 12/17. Says he hasn't had medication In 3 months and is no longer working since surgery.

## 2018-09-11 NOTE — Patient Instructions (Signed)

## 2018-09-11 NOTE — Progress Notes (Signed)
Established Patient Office Visit  Subjective:  Patient ID: Darren Larsen, male    DOB: 12-13-1965  Age: 53 y.o. MRN: [id number]  CC: Follow-up of hypertension and referral to eye specialist HPI Darren Larsen presents for follow-up of hypertension.  Patient reports that he was out of his blood pressure medications, amlodipine and chlorthalidone until recently getting them refilled.  Patient has not taken his blood pressure medications in the past few days.  Patient is status post surgery for correction of a spermatocele.  Surgery was done on 06/26/2018 by Dr. Ellison Hughs.  Patient has also had recurrent issues with pain in his left leg and now the pain is mostly in his left upper thigh which he thinks is related to his surgery but also has pain in his left inner knee. Patient did try to pick-up the arthritis cream in the past at a local pharmacy but this was over $100.  Patient states that he has had some falls after his right knee suddenly buckles causing him to fall.  Patient is not having any pain in the right knee but the right knee will suddenly give away.  Patient is using a cane for ambulation to try and help prevent falls.  Patient states that he was never contacted regarding an appointment with an eye care specialist.  Patient has prior diagnosis of retinitis pigmentosa but has had no recent follow-up with an eye care specialist.  Patient believes that he likely inherited this disorder from his father who also had issues with poor vision.  Patient reports issues with both double vision and blurred vision and can only read large print.  Patient does not drive secondary to issues with his vision.  Patient also reports history of injury to the right hand requiring surgery and patient states that a few years after the surgery, he lost the ability to straighten out to the fingers on his right hand.  Patient states that he can no longer write properly and there other activities that he cannot perform  due to the inability to properly open and close his right hand.  Patient is not currently working due to his health issues and patient states that this is causing him to feel depressed as he has never been unable to support himself.  Past Medical History:  Diagnosis Date  . Anxiety   . Arthritis    hands  . History of concussion    per pt concussion without LOC 1980s -- no residuals  . History of seizure    06-21-2018  per pt was told over 30 yrs ago had a seizure while sleeping  . Hypertension   . Retinitis pigmentosa congenital   . Spermatocele    left  . Wears glasses     Past Surgical History:  Procedure Laterality Date  . INGUINAL HERNIA REPAIR Left infant;  age 16;  82s  . SPERMATOCELECTOMY Left 06/26/2018   Procedure: SPERMATOCELECTOMY;  Surgeon: Ceasar Mons, MD;  Location: [facility];  Service: Urology;  Laterality: Left;    Family History  Problem Relation Age of Onset  . Hypertension Mother     Social History   Socioeconomic History  . Marital status: Legally Separated    Spouse name: Not on file  . Number of children: 5  . Years of education: 11   . Highest education level: Not on file  Occupational History  . Occupation: Biomedical engineer   . Occupation: Ambulance person   Social Needs  . Financial resource strain: Not  on file  . Food insecurity:    Worry: Not on file    Inability: Not on file  . Transportation needs:    Medical: Not on file    Non-medical: Not on file  Tobacco Use  . Smoking status: Former Smoker    Packs/day: 0.50    Years: 20.00    Pack years: 10.00    Types: Cigarettes    Last attempt to quit: 06/14/2018    Years since quitting: 0.2  . Smokeless tobacco: Never Used  . Tobacco comment: recently stopped smoking approx. 06-14-2018  Substance and Sexual Activity  . Alcohol use: Yes    Comment: per pt average 32 oz beer per day  . Drug use: Yes    Types: Marijuana    Comment: 06-21-2018 last marijuana per pt 3 wks ago  . Sexual  activity: Not on file  Lifestyle  . Physical activity:    Days per week: Not on file    Minutes per session: Not on file  . Stress: Not on file  Relationships  . Social connections:    Talks on phone: Not on file    Gets together: Not on file    Attends religious service: Not on file    Active member of club or organization: Not on file    Attends meetings of clubs or organizations: Not on file    Relationship status: Not on file  . Intimate partner violence:    Fear of current or ex partner: Not on file    Emotionally abused: Not on file    Physically abused: Not on file    Forced sexual activity: Not on file  Other Topics Concern  . Not on file  Social History Narrative   Live with niece   Children grown and gone.   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     Outpatient Medications Prior to Visit  Medication Sig Dispense Refill  . acetaminophen (TYLENOL) 500 MG tablet Take 1 tablet (500 mg total) by mouth every 6 (six) hours as needed. (Patient taking differently: Take 500 mg by mouth every 6 (six) hours as needed (pain.). ) 30 tablet 0  . amLODipine (NORVASC) 10 MG tablet Take 1 tablet (10 mg total) by mouth daily. (Patient taking differently: Take 10 mg by mouth at bedtime. ) 30 tablet 4  . Aspirin-Acetaminophen-Caffeine (GOODYS EXTRA STRENGTH) 520-260-32.5 MG PACK Take 1 packet by mouth 2 (two) times daily as needed (for pain.).    Marland Kitchen chlorthalidone (HYGROTON) 25 MG tablet Take 0.5 tablets (12.5 mg total) by mouth daily. (Patient taking differently: Take 25 mg by mouth daily. ) 15 tablet 4  . diclofenac sodium (VOLTAREN) 1 % GEL appply 4 gm to shoulder area up to 4 times per day as needed for pain (Patient not taking: Reported on 06/21/2018) 2 Tube 4  . gabapentin (NEURONTIN) 300 MG capsule Take 1 capsule (300 mg total) by mouth at bedtime. 30 capsule 4  . ibuprofen (ADVIL,MOTRIN) 200 MG tablet Take 400 mg by  mouth every 6 (six) hours as needed for headache or moderate pain.    . methocarbamol (ROBAXIN) 500 MG tablet Take 1 tablet (500 mg total) by mouth 2 (two) times daily. As needed for muscle spasm (Patient taking differently: Take 500 mg by mouth 2 (two) times daily as needed for muscle spasms. ) 60 tablet 6  . Omega-3 1000 MG  CAPS Take 2,000 mg by mouth daily.     Marland Kitchen Ophthalmic Irrigation Solution (EYE WASH OP) Place 1 drop into both eyes 4 (four) times daily as needed (excessive dry eyes.).     Marland Kitchen thiamine (VITAMIN B-1) 100 MG tablet Take 1 tablet (100 mg total) by mouth daily. (Patient not taking: Reported on 06/25/2018) 30 tablet 6   No facility-administered medications prior to visit.     Allergies  Allergen Reactions  . Aspirin Other (See Comments)    NOSE BLEED    ROS Review of Systems  Constitutional: Positive for fatigue. Negative for chills and fever.  HENT: Negative for hearing loss and trouble swallowing.   Eyes: Positive for visual disturbance. Negative for photophobia and pain.  Respiratory: Negative for cough and shortness of breath.   Cardiovascular: Negative for chest pain, palpitations and leg swelling.  Gastrointestinal: Negative for abdominal pain, blood in stool, constipation, diarrhea and nausea.  Endocrine: Negative for polydipsia, polyphagia and polyuria.  Genitourinary: Negative for dysuria and frequency.  Musculoskeletal: Positive for arthralgias, back pain and gait problem.  Neurological: Positive for headaches (occasional headaches when not taking blood pressure medicaitions). Negative for dizziness.  Psychiatric/Behavioral: Positive for dysphoric mood. Negative for self-injury and suicidal ideas. The patient is nervous/anxious.       Objective:    Physical Exam BP (!) 153/111 (BP Location: Right Arm, Patient Position: Sitting, Cuff Size: Normal)   Pulse (!) 112   Temp 99 F (37.2 C) (Oral)   Ht 5\' 9"  (1.753 m)   Wt 205 lb (93 kg)   SpO2 96%   BMI  30.27 kg/m  nurse's notes and vital signs reviewed  General-well-nourished, well-developed male in no acute distress sitting on exam table.  Patient with visible exophthalmic appearance of his eyes.  Patient is accompanied by his wife at today's visit Neck-supple, no lymphadenopathy, no thyromegaly, no carotid bruit Cardiovascular-regular rate and rhythm Lungs-clear to auscultation bilaterally, breathing is nonlabored Abdomen-mild truncal obesity, soft and nontender Back-no CVA tenderness, patient with some mild lumbosacral paraspinous spasm Musculoskeletal-patient with left medial knee joint line tenderness, no reproducible tenderness of the right knee.  Patient with contractures/inability to straighten the last 2 fingers on the ulnar side of the right hand Extremities-no edema Psych- normal mood and judgment   Wt Readings from Last 3 Encounters:  06/26/18 207 lb 8 oz (94.1 kg)  06/21/18 206 lb (93.4 kg)  05/18/18 211 lb (95.7 kg)     Health Maintenance Due  Topic Date Due  . TETANUS/TDAP  09/25/1983  . COLONOSCOPY  09/25/2014    There are no preventive care reminders to display for this patient.  Lab Results  Component Value Date   TSH 0.962 05/18/2018   Lab Results  Component Value Date   WBC 7.4 06/26/2018   HGB 12.1 (L) 06/26/2018   HCT 38.7 (L) 06/26/2018   MCV 75.9 (L) 06/26/2018   PLT 234 06/26/2018   Lab Results  Component Value Date   NA 138 06/26/2018   K 3.6 06/26/2018   CO2 27 06/26/2018   GLUCOSE 91 06/26/2018   BUN 12 06/26/2018   CREATININE 0.86 06/26/2018   BILITOT 0.4 05/18/2018   ALKPHOS 114 05/18/2018   AST 26 05/18/2018   ALT 27 05/18/2018   PROT 7.6 05/18/2018   ALBUMIN 4.8 05/18/2018   CALCIUM 9.2 06/26/2018   ANIONGAP 7 06/26/2018   Lab Results  Component Value Date   CHOL 164 05/29/2018   Lab Results  Component  Value Date   HDL 90 05/29/2018   Lab Results  Component Value Date   LDLCALC 54 05/29/2018   Lab Results   Component Value Date   TRIG 98 05/29/2018   Lab Results  Component Value Date   CHOLHDL 1.8 05/29/2018   Lab Results  Component Value Date   HGBA1C 5.2 05/18/2018      Assessment & Plan:  1. Essential hypertension Patient was asked to take the amlodipine and chlorthalidone on a daily basis to help lower his blood pressure.  Patient has been asked to return to clinic for nurse visit in a few weeks to have blood pressure rechecked to make sure that his current medications are controlling his blood pressure.  Patient also encouraged to follow a DASH diet and low impact cardiovascular exercise such as walking is advised once patient has had improvement in knee pain and decreased fall risk.  2. Chronic pain of left knee Patient has been encouraged to apply for financial assistance program as this will help with specialty appointments as well as lower the cost of medications if patient is approved.  Patient is being referred to orthopedics for further evaluation and treatment of his chronic left knee pain and patient with suspected lumbar radiculopathy which may be causing him to have sudden give in his right knee leading to falls. - AMB referral to orthopedics  3. Contracture of joint of finger of right hand Patient with complaint of contractures of the last 2 fingers on the ulnar side of the right hand.  Patient will be referred to orthopedics for further evaluation and treatment - AMB referral to orthopedics  4. Back pain with radiation Patient will continue the use of Robaxin, gabapentin and ibuprofen to help with back pain with radiation and patient will be referred to orthopedics for further evaluation and treatment - AMB referral to orthopedics  5. Retinitis pigmentosa Patient reports a history of retinitis pigmentosa and patient with complaint of worsening of his visual acuity.  Patient is being referred to ophthalmology for further evaluation and treatment - Ambulatory referral to  Ophthalmology  6. Anxiety and depression Patient with complaint of anxiety and depression related to his current health issues and inability to work.  Patient will be referred to social work for further evaluation.  Patient reports that he is agreeable to counseling but that he has a sister who has had hospitalizations for mental health issues and he believes that the medications that she was prescribed made her condition worse and patient is not interested in medication to help with anxiety or depression at this time.  Patient denies any thoughts of self-harm or suicidal thoughts/ideations or plan. - Ambulatory referral to Social Work  An After Visit Summary was printed and given to the patient.  Allergies as of 09/11/2018      Reactions   Aspirin Other (See Comments)   NOSE BLEED      Medication List       Accurate as of September 11, 2018  9:53 PM. Always use your most recent med list.        acetaminophen 500 MG tablet Commonly known as:  TYLENOL Take 1 tablet (500 mg total) by mouth every 6 (six) hours as needed.   amLODipine 10 MG tablet Commonly known as:  NORVASC Take 1 tablet (10 mg total) by mouth daily.   chlorthalidone 25 MG tablet Commonly known as:  HYGROTON Take 0.5 tablets (12.5 mg total) by mouth daily.   diclofenac sodium  1 % Gel Commonly known as:  VOLTAREN appply 4 gm to shoulder area up to 4 times per day as needed for pain   EYE [place] OP Place 1 drop into both eyes 4 (four) times daily as needed (excessive dry eyes.).   gabapentin 300 MG capsule Commonly known as:  NEURONTIN Take 1 capsule (300 mg total) by mouth at bedtime.   GOODYS EXTRA STRENGTH 520-260-32.5 MG Pack Generic drug:  Aspirin-Acetaminophen-Caffeine Take 1 packet by mouth 2 (two) times daily as needed (for pain.).   ibuprofen 200 MG tablet Commonly known as:  ADVIL,MOTRIN Take 400 mg by mouth every 6 (six) hours as needed for headache or moderate pain.   methocarbamol 500 MG  tablet Commonly known as:  ROBAXIN Take 1 tablet (500 mg total) by mouth 2 (two) times daily. As needed for muscle spasm   Omega-3 1000 MG Caps Take 2,000 mg by mouth daily.   thiamine 100 MG tablet Commonly known as:  VITAMIN B-1 Take 1 tablet (100 mg total) by mouth daily.        Follow-up: Return in about 3 months (around 12/12/2018) for HTN-2 week NV.   Antony Blackbird, MD

## 2018-09-21 ENCOUNTER — Telehealth: Payer: Self-pay | Admitting: Licensed Clinical Social Worker

## 2018-09-21 NOTE — Telephone Encounter (Signed)
Call placed to patient to follow up on behavioral health referral. LCSWA left message for a return call.

## 2018-10-03 ENCOUNTER — Telehealth: Payer: Self-pay | Admitting: Licensed Clinical Social Worker

## 2018-10-03 NOTE — Telephone Encounter (Signed)
Calls placed to patient to follow up on behavioral health consult. Unable to leave a messages due to ongoing busy signal.

## 2018-11-24 MED FILL — AMLODIPINE BESYLATE 10 MG T: 10 | 30 days supply | Qty: 30 | Fill #3

## 2018-12-12 ENCOUNTER — Encounter: Payer: Self-pay | Admitting: Family Medicine

## 2018-12-12 ENCOUNTER — Ambulatory Visit: Payer: Self-pay | Attending: Family Medicine | Admitting: Family Medicine

## 2018-12-12 ENCOUNTER — Other Ambulatory Visit: Payer: Self-pay

## 2018-12-12 DIAGNOSIS — M25511 Pain in right shoulder: Secondary | ICD-10-CM

## 2018-12-12 DIAGNOSIS — M549 Dorsalgia, unspecified: Secondary | ICD-10-CM

## 2018-12-12 DIAGNOSIS — G8929 Other chronic pain: Secondary | ICD-10-CM

## 2018-12-12 DIAGNOSIS — R200 Anesthesia of skin: Secondary | ICD-10-CM

## 2018-12-12 DIAGNOSIS — Z791 Long term (current) use of non-steroidal anti-inflammatories (NSAID): Secondary | ICD-10-CM

## 2018-12-12 DIAGNOSIS — I10 Essential (primary) hypertension: Secondary | ICD-10-CM

## 2018-12-12 MED ORDER — NAPROXEN 500 MG PO TABS: 500.0000 mg | ORAL_TABLET | Freq: Two times a day (BID) | ORAL | 5 refills | Status: DC

## 2018-12-12 MED ORDER — FAMOTIDINE 20 MG PO TABS: 20.0000 mg | ORAL_TABLET | Freq: Two times a day (BID) | ORAL | 11 refills | Status: DC

## 2018-12-12 MED ORDER — GABAPENTIN 300 MG PO CAPS: 300.0000 mg | ORAL_CAPSULE | Freq: Three times a day (TID) | ORAL | 5 refills | Status: DC

## 2018-12-12 MED ORDER — OMEPRAZOLE 20 MG PO CPDR: 20.0000 mg | DELAYED_RELEASE_CAPSULE | Freq: Every day | ORAL | 6 refills | Status: DC

## 2018-12-12 NOTE — Progress Notes (Signed)
Follow up for htn and med refills  Per pt his hip is hurting. Per pt after he had the testical surgery   Can't sleep due to body pain.   Per pt he is randomly falling due to legs just giving out. Per patient he never knows when it'll happen.

## 2018-12-12 NOTE — Progress Notes (Signed)
Virtual Visit via Telephone Note  I connected with Darren Larsen on 12/12/18 at 11:10 AM EDT by telephone and verified that I am speaking with the correct person using two identifiers.   I discussed the limitations, risks, security and privacy concerns of performing an evaluation and management service by telephone and the availability of in person appointments. I also discussed with the patient that there may be a patient responsible charge related to this service. The patient expressed understanding and agreed to proceed.  Patient Location: Home Provider Location: Office Others participating in call: Emilio Aspen, RMA   History of Present Illness:      53 yo male who is seen in follow-up of hypertension and he has a continued complaint of chronic pain in his left hip, right shoulder pain as well as sensation of his left leg giving away and numbness in both hands.  He reports that his blood pressures been well controlled with the use of amlodipine 10 mg.  He has had no headaches or dizziness related to his blood pressure.  He however continues to have chronic issues with back pain with radiation to the left hip and left leg and he sometimes feels as if his left leg goes limp at times.  Patient continues to have pain which radiates from his left hip down his whole leg pain is sharp in nature and is a 8-10 on a 0-to-10 scale.  Pain is worse if he has been sitting or still for a while and then starts to get up to walk.  Patient has been trying to take Advil for back pain/shoulder pain and hip pain however Advil has caused him to have headaches.  He has also been trying Tylenol which is not as effective.  He also reports chronic issues with pain in the right shoulder.  He also has had issues with bilateral numbness in his hands and he states that he has been told that this was from carpal tunnel but was then told by different provider that the numbness was not carpal tunnel so he would like to have  this further evaluated.      He denies any issues with dizziness, no headaches other than does associated with the use of Advil.  He denies any chest pain or palpitations, no shortness of breath or cough.  He has had no urinary frequency, urgency or dysuria.  No abdominal pain-no nausea/vomiting/diarrhea or constipation.  No blood in the stool or black stools.  No peripheral edema.  He has had some fatigue as well as poor sleep related to pain.   Past Medical History:  Diagnosis Date   Anxiety    Arthritis    hands   History of concussion    per pt concussion without LOC 1980s -- no residuals   History of seizure    06-21-2018  per pt was told over 30 yrs ago had a seizure while sleeping   Hypertension    Retinitis pigmentosa congenital    Spermatocele    left   Wears glasses     Past Surgical History:  Procedure Laterality Date   INGUINAL HERNIA REPAIR Left infant;  age 65;  11s   SPERMATOCELECTOMY Left 06/26/2018   Procedure: SPERMATOCELECTOMY;  Surgeon: Ceasar Mons, MD;  Location: [facility];  Service: Urology;  Laterality: Left;    Family History  Problem Relation Age of Onset   Hypertension Mother     Social History   Tobacco Use   Smoking status:  Former Smoker    Packs/day: 0.50    Years: 20.00    Pack years: 10.00    Types: Cigarettes    Last attempt to quit: 06/14/2018    Years since quitting: 0.4   Smokeless tobacco: Never Used   Tobacco comment: recently stopped smoking approx. 06-14-2018  Substance Use Topics   Alcohol use: Yes    Comment: per pt average 32 oz beer per day   Drug use: Yes    Types: Marijuana    Comment: 06-21-2018 last marijuana per pt 3 wks ago     Allergies  Allergen Reactions   Aspirin Other (See Comments)    NOSE BLEED       Observations/Objective: No vital signs or physical exam conducted as visit was done via telephone  Assessment and Plan: 1. Essential hypertension He reports that he has been  able to control his blood pressure with the use of daily amlodipine 10 mg and has had no issues with peripheral edema.  Patient is to continue this medication, continue a DASH diet and continue to monitor his blood sugars with goal of 130/80 or less  2. Bilateral hand numbness Patient with complaint of issues with bilateral hand numbness and he states that he has been told in the past that it might be carpal tunnel but then by someone else was told that it was not carpal tunnel.  Prescription provided for gabapentin refilled to help with nerve type pain.  He is also being referred to orthopedics for further evaluation and treatment. - gabapentin (NEURONTIN) 300 MG capsule; Take 1 capsule (300 mg total) by mouth 3 (three) times daily. For nerve pain  Dispense: 90 capsule; Refill: 5 - AMB referral to orthopedics  3. Back pain with radiation Patient with continued issues with lumbar radiculopathy.  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  Patient reports difficulty sleeping secondary to pain.  Patient is provided with refill of his gabapentin as well as new prescription for naproxen.  Patient reports that he has headaches with Advil but is able to take naproxen.  Patient was reminded to eat prior to taking naproxen/NSAIDs to help avoid stomach upset/gastritis or GI bleed.  He has been referred to orthopedics for further evaluation and treatment. - gabapentin (NEURONTIN) 300 MG capsule; Take 1 capsule (300 mg total) by mouth 3 (three) times daily. For nerve pain  Dispense: 90 capsule; Refill: 5 - naproxen (NAPROSYN) 500 MG tablet; Take 1 tablet (500 mg total) by mouth 2 (two) times daily with a meal. As needed for pain  Dispense: 60 tablet; Refill: 5 - AMB referral to orthopedics  4. Chronic right shoulder pain Orthopedic referral being done for patient's chronic right shoulder pain. - AMB referral to orthopedics  5. Long term use of NSAIDs Pepcid is on back  order therefore patient will be placed on Omeprazole 20 mg once per day for stomach protection/prevention of gastritis or GI bleed  Follow Up Instructions:Return in about 3 months (around 03/14/2019).    I discussed the assessment and treatment plan with the patient. The patient was provided an opportunity to ask questions and all were answered. The patient agreed with the plan and demonstrated an understanding of the instructions.   The patient was advised to call back or seek an in-person evaluation if the symptoms worsen or if the condition fails to improve as anticipated.  I provided 11 minutes of non-face-to-face time during this encounter.  Antony Blackbird, MD

## 2018-12-17 ENCOUNTER — Encounter: Payer: Self-pay | Admitting: Family Medicine

## 2019-02-07 MED FILL — AMLODIPINE BESYLATE 10 MG T: 10 | 30 days supply | Qty: 30 | Fill #4

## 2019-02-07 MED FILL — GABAPENTIN 300 MG CAPSULE: 300 | 30 days supply | Qty: 90 | Fill #0

## 2019-03-13 ENCOUNTER — Ambulatory Visit: Payer: Self-pay | Admitting: Family Medicine

## 2019-03-13 ENCOUNTER — Ambulatory Visit: Payer: Self-pay | Attending: Family Medicine | Admitting: Physician Assistant

## 2019-03-13 DIAGNOSIS — M549 Dorsalgia, unspecified: Secondary | ICD-10-CM

## 2019-03-13 MED ORDER — NAPROXEN 500 MG PO TABS: 500.0000 mg | ORAL_TABLET | Freq: Two times a day (BID) | ORAL | 5 refills | Status: DC

## 2019-03-13 MED FILL — NAPROXEN 500 MG TABLET: 500 | 30 days supply | Qty: 60 | Fill #0

## 2019-03-13 NOTE — Progress Notes (Signed)
Virtual Visit via Telephone Note  I connected with Darren Larsen on 03/13/19 at  3:10 PM EDT by telephone and verified that I am speaking with the correct person using two identifiers.   I discussed the limitations, risks, security and privacy concerns of performing an evaluation and management service by telephone and the availability of in person appointments. I also discussed with the patient that there may be a patient responsible charge related to this service. The patient expressed understanding and agreed to proceed.  Patient location:  home My Location:  [place] office Persons on the call:  Me and the patient    History of Present Illness: Patient c/o L leg pain for about 4 years.  Hurts from L hip to knee.  No h/o cancer.  Feels cold.  No swelling.  No redness.  Worse with walking.  No fever no SOB.  Pain seems to be worsening over time.  NKI.  Cyst removed from testicle since December 2019.    Observations/Objective:  NAD.  A&Ox3   Assessment and Plan: 1. Back pain with radiation - Ambulatory referral to Orthopedic Surgery - naproxen (NAPROSYN) 500 MG tablet; Take 1 tablet (500 mg total) by mouth 2 (two) times daily with a meal. As needed for pain  Dispense: 60 tablet; Refill: 5   Follow Up Instructions: 2-3 months with PCP   I discussed the assessment and treatment plan with the patient. The patient was provided an opportunity to ask questions and all were answered. The patient agreed with the plan and demonstrated an understanding of the instructions.   The patient was advised to call back or seek an in-person evaluation if the symptoms worsen or if the condition fails to improve as anticipated.  I provided 12 minutes of non-face-to-face time during this encounter.   Freeman Caldron, PA-C  Patient ID: Darren Larsen, male   DOB: 1965-10-18, 53 y.o.   MRN: [id number]

## 2019-03-19 MED FILL — GABAPENTIN 300 MG CAPSULE: 300 | 30 days supply | Qty: 90 | Fill #1

## 2019-03-19 MED FILL — AMLODIPINE BESYLATE 10 MG T: 10 | 30 days supply | Qty: 30 | Fill #5

## 2019-03-25 MED FILL — NAPROXEN 500 MG TABLET: 500 | 30 days supply | Qty: 60 | Fill #0

## 2019-07-01 ENCOUNTER — Other Ambulatory Visit: Payer: Self-pay | Admitting: Critical Care Medicine

## 2019-07-01 DIAGNOSIS — G8929 Other chronic pain: Secondary | ICD-10-CM

## 2019-07-01 DIAGNOSIS — I10 Essential (primary) hypertension: Secondary | ICD-10-CM

## 2019-07-01 DIAGNOSIS — M25511 Pain in right shoulder: Secondary | ICD-10-CM

## 2019-07-01 DIAGNOSIS — M62830 Muscle spasm of back: Secondary | ICD-10-CM

## 2019-07-01 MED FILL — ?OMEPRAZOLE 20MG CAP DR: 20 | 30 days supply | Qty: 30 | Fill #0

## 2019-07-01 MED FILL — ?NAPROXEN 500 MG TABS: 500 | 30 days supply | Qty: 60 | Fill #1

## 2019-07-01 MED FILL — GABAPENTIN 300 MG CAPSULE: 300 | 30 days supply | Qty: 90 | Fill #2

## 2019-07-02 MED FILL — ?AMLODIPINE BESYLATE 10 MG: 10 | 30 days supply | Qty: 30 | Fill #0

## 2019-07-02 MED FILL — METHOCARBAMOL 500 MG TABS: 500 | 30 days supply | Qty: 60 | Fill #0

## 2019-07-30 MED FILL — METHOCARBAMOL 500 MG TABS: 500 | 30 days supply | Qty: 60 | Fill #0

## 2019-07-30 MED FILL — ?OMEPRAZOLE 20MG CAP DR: 20 | 30 days supply | Qty: 30 | Fill #0

## 2019-07-30 MED FILL — ?AMLODIPINE BESYLATE 10 MG: 10 | 30 days supply | Qty: 30 | Fill #0

## 2019-07-30 MED FILL — NAPROXEN 500 MG TABLET: 500 | 30 days supply | Qty: 60 | Fill #0

## 2019-07-30 MED FILL — GABAPENTIN 300 MG CAPSULE: 300 | 30 days supply | Qty: 90 | Fill #2

## 2019-09-09 MED FILL — AMLODIPINE BESYLATE 10 MG T: 10 | 30 days supply | Qty: 30 | Fill #1

## 2019-11-07 MED FILL — GABAPENTIN 300 MG CAPSULE: 300 | 30 days supply | Qty: 90 | Fill #3

## 2019-11-07 MED FILL — AMLODIPINE BESYLATE 10 MG T: 10 | 30 days supply | Qty: 30 | Fill #2

## 2019-11-18 ENCOUNTER — Ambulatory Visit: Payer: Self-pay | Attending: Family Medicine | Admitting: Family Medicine

## 2019-11-18 ENCOUNTER — Encounter: Payer: Self-pay | Admitting: Family Medicine

## 2019-11-18 ENCOUNTER — Other Ambulatory Visit: Payer: Self-pay

## 2019-11-18 VITALS — BP 150/98 | HR 83 | Temp 97.7°F | Ht 69.0 in | Wt 207.4 lb

## 2019-11-18 DIAGNOSIS — M24541 Contracture, right hand: Secondary | ICD-10-CM

## 2019-11-18 DIAGNOSIS — M5442 Lumbago with sciatica, left side: Secondary | ICD-10-CM

## 2019-11-18 DIAGNOSIS — Z0000 Encounter for general adult medical examination without abnormal findings: Secondary | ICD-10-CM

## 2019-11-18 DIAGNOSIS — F419 Anxiety disorder, unspecified: Secondary | ICD-10-CM

## 2019-11-18 DIAGNOSIS — F329 Major depressive disorder, single episode, unspecified: Secondary | ICD-10-CM

## 2019-11-18 DIAGNOSIS — Z1211 Encounter for screening for malignant neoplasm of colon: Secondary | ICD-10-CM

## 2019-11-18 DIAGNOSIS — H3552 Pigmentary retinal dystrophy: Secondary | ICD-10-CM

## 2019-11-18 DIAGNOSIS — I10 Essential (primary) hypertension: Secondary | ICD-10-CM

## 2019-11-18 DIAGNOSIS — G8929 Other chronic pain: Secondary | ICD-10-CM

## 2019-11-18 DIAGNOSIS — R351 Nocturia: Secondary | ICD-10-CM

## 2019-11-18 DIAGNOSIS — M2550 Pain in unspecified joint: Secondary | ICD-10-CM

## 2019-11-18 DIAGNOSIS — R1032 Left lower quadrant pain: Secondary | ICD-10-CM

## 2019-11-18 DIAGNOSIS — F32A Depression, unspecified: Secondary | ICD-10-CM

## 2019-11-18 DIAGNOSIS — R200 Anesthesia of skin: Secondary | ICD-10-CM

## 2019-11-18 DIAGNOSIS — Z125 Encounter for screening for malignant neoplasm of prostate: Secondary | ICD-10-CM

## 2019-11-18 DIAGNOSIS — Z79899 Other long term (current) drug therapy: Secondary | ICD-10-CM

## 2019-11-18 DIAGNOSIS — Z1331 Encounter for screening for depression: Secondary | ICD-10-CM

## 2019-11-18 MED ORDER — AMLODIPINE BESYLATE 10 MG PO TABS: 10.0000 mg | ORAL_TABLET | Freq: Every day | ORAL | 1 refills | Status: DC

## 2019-11-18 NOTE — Progress Notes (Signed)
Complete Physical

## 2019-11-18 NOTE — Progress Notes (Signed)
Established Patient Office Visit  Subjective:  Patient ID: Darren Larsen, male    DOB: 28-Jan-1966  Age: 54 y.o. MRN: [id number]  CC: annual physical exam; chronic pain and other issues- Antony Blackbird, MD  HPI Darren Larsen, 54 yo male, who presents for annual physical exam. He was last via telephone visit for low back pain with radiation on 03/13/2019 by another provider and referred to Orthopedics.  Patient with complaint of continued severe low back pain with radiation down the left leg with the greatest pain between the hip and knee.  He feels that his back and leg pain always stay around the feet on a 0-to-10 scale and pain is worse with movement such as walking or with trying to sleep as he cannot find a comfortable position.  He reports that he continues to have scrotal/testicular pain on the left status post surgery in December of last year due to a spermatocele.  He would like a referral back to urology.  Patient states that he has chronic discomfort that he almost wishes he never had the surgery.  Pain is worse with movement/ambulation.  He also reports issues with erectile dysfunction as well as frequent urination especially at night.  He denies any dysuria.  No lower abdominal or pelvic pain.  He denies any blood in the stool or black stools but is willing to be referred to have his colonoscopy but he feels that he needs referral to pain management prior to the gastroenterology appointment otherwise he would not be able to be still long enough to have colonoscopy done with his current issues with chronic back pain with radiation.         He states that he has only had 1 visit with an eye specialist regarding his history of retinitis pigmentosa and needs to have additional follow-up as he continues to have difficulty with his vision.  He reports that his vision remains cloudy and indistinct.  He additionally reports continued issues with bilateral hand pain and numbness as well as pain and difficulty  moving the fingers on the right hand.  He reports that he cannot really make a fist with his right hand or fully straighten out some of the fingers on his right hand.          He reports that he is in too much pain at today's visit to have blood work done as he has had increased pain and stiffness while sitting in the exam room waiting for his appointment.  He reports that he can schedule a later lab visit.  He does need refill of his blood pressure medication.  He has been out of medication for his blood pressure and also feels that his chronic pain issues are causing elevations in his blood pressure.  He does have some occasional generalized headaches not sure if the headaches are related to his blood pressure, thinning, fatigue or the issues with his vision.  Past Medical History:  Diagnosis Date  . Anxiety   . Arthritis    hands  . History of concussion    per pt concussion without LOC 1980s -- no residuals  . History of seizure    06-21-2018  per pt was told over 30 yrs ago had a seizure while sleeping  . Hypertension   . Retinitis pigmentosa congenital   . Spermatocele    left  . Wears glasses     Past Surgical History:  Procedure Laterality Date  . INGUINAL HERNIA REPAIR Left infant;  age 57;  37s  . SPERMATOCELECTOMY Left 06/26/2018   Procedure: SPERMATOCELECTOMY;  Surgeon: Ceasar Mons, MD;  Location: [facility];  Service: Urology;  Laterality: Left;    Family History  Problem Relation Age of Onset  . Hypertension Mother     Social History   Socioeconomic History  . Marital status: Legally Separated    Spouse name: Not on file  . Number of children: 5  . Years of education: 60   . Highest education level: Not on file  Occupational History  . Occupation: Biomedical engineer   . Occupation: Ambulance person   Tobacco Use  . Smoking status: Former Smoker    Packs/day: 0.50    Years: 20.00    Pack years: 10.00    Types: Cigarettes    Quit date: 06/14/2018    Years since  quitting: 1.4  . Smokeless tobacco: Never Used  . Tobacco comment: recently stopped smoking approx. 06-14-2018  Substance and Sexual Activity  . Alcohol use: Yes    Comment: per pt average 32 oz beer per day  . Drug use: Yes    Types: Marijuana    Comment: 06-21-2018 last marijuana per pt 3 wks ago  . Sexual activity: Not on file  Other Topics Concern  . Not on file  Social History Narrative   Live with niece   Children grown and gone.   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    Social Determinants of Health   Financial Resource Strain:   . Difficulty of Paying Living Expenses:   Food Insecurity:   . Worried About Charity fundraiser in the Last Year:   . Arboriculturist in the Last Year:   Transportation Needs:   . Film/video editor (Medical):   Marland Kitchen Lack of Transportation (Non-Medical):   Physical Activity:   . Days of Exercise per Week:   . Minutes of Exercise per Session:   Stress:   . Feeling of Stress :   Social Connections:   . Frequency of Communication with Friends and Family:   . Frequency of Social Gatherings with Friends and Family:   . Attends Religious Services:   . Active Member of Clubs or Organizations:   . Attends Archivist Meetings:   Marland Kitchen Marital Status:   Intimate Partner Violence:   . Fear of Current or Ex-Partner:   . Emotionally Abused:   Marland Kitchen Physically Abused:   . Sexually Abused:     Outpatient Medications Prior to Visit  Medication Sig Dispense Refill  . acetaminophen (TYLENOL) 500 MG tablet Take 1 tablet (500 mg total) by mouth every 6 (six) hours as needed. 30 tablet 0  . Aspirin-Acetaminophen-Caffeine (GOODYS EXTRA STRENGTH) 520-260-32.5 MG PACK Take 1 packet by mouth 2 (two) times daily as needed (for pain.).    Marland Kitchen gabapentin (NEURONTIN) 300 MG capsule Take 1 capsule (300 mg total) by mouth 3 (three) times daily. For nerve pain 90 capsule 5  . methocarbamol  (ROBAXIN) 500 MG tablet TAKE 1 TABLET (500 MG TOTAL) BY MOUTH 2 (TWO) TIMES DAILY. 60 tablet 0  . naproxen (NAPROSYN) 500 MG tablet Take 1 tablet (500 mg total) by mouth 2 (two) times daily with a meal. As needed for pain 60 tablet 5  . Omega-3 1000 MG CAPS Take 2,000 mg by mouth daily.     Marland Kitchen omeprazole (PRILOSEC) 20 MG capsule Take 1  capsule (20 mg total) by mouth daily. For stomach protection 30 capsule 6  . Ophthalmic Irrigation Solution (EYE WASH OP) Place 1 drop into both eyes 4 (four) times daily as needed (excessive dry eyes.).     Marland Kitchen amLODipine (NORVASC) 10 MG tablet TAKE 1 TABLET (10 MG TOTAL) BY MOUTH DAILY. 30 tablet 2  . chlorthalidone (HYGROTON) 25 MG tablet Take 0.5 tablets (12.5 mg total) by mouth daily. (Patient not taking: Reported on 03/13/2019) 15 tablet 4  . diclofenac sodium (VOLTAREN) 1 % GEL appply 4 gm to shoulder area up to 4 times per day as needed for pain (Patient not taking: Reported on 12/12/2018) 2 Tube 4  . thiamine (VITAMIN B-1) 100 MG tablet Take 1 tablet (100 mg total) by mouth daily. (Patient not taking: Reported on 12/12/2018) 30 tablet 6   No facility-administered medications prior to visit.    Allergies  Allergen Reactions  . Aspirin Other (See Comments)    NOSE BLEED    ROS Review of Systems  Constitutional: Positive for fatigue. Negative for chills and fever.  HENT: Negative for sore throat and trouble swallowing.   Eyes: Positive for visual disturbance. Negative for photophobia.  Respiratory: Negative for cough and shortness of breath.   Cardiovascular: Negative for chest pain and palpitations.  Gastrointestinal: Negative for abdominal pain, constipation, diarrhea and nausea.  Endocrine: Negative for cold intolerance, heat intolerance, polydipsia, polyphagia and polyuria.  Genitourinary: Positive for frequency (nocturia) and testicular pain (s/p surgery for spermatocele). Negative for dysuria.  Musculoskeletal: Positive for arthralgias, back pain and  gait problem.  Neurological: Positive for numbness (both hands and back pain with radiation) and headaches (occasional- possibly from vision issues). Negative for dizziness.  Hematological: Negative for adenopathy. Does not bruise/bleed easily.  Psychiatric/Behavioral: Negative for self-injury and suicidal ideas. The patient is nervous/anxious.       Objective:    Physical Exam  Constitutional: He is oriented to person, place, and time. He appears well-developed and well-nourished.  Well-nourished well-developed overweight for height male in no acute distress while sitting still a patient with pain with attempt to rise from a seated position patient with difficulty with ambulation secondary to pain/abnormal gait  Eyes:  Slightly exophthalmic appearance of the eyes  Neck: No JVD present.  Cardiovascular: Normal rate and regular rhythm.  Pulmonary/Chest: Effort normal and breath sounds normal.  Abdominal: Soft. There is no abdominal tenderness. There is no rebound and no guarding.  Musculoskeletal:        General: Tenderness and deformity (Patient with appearance of contracture to some of the fingers of the right hand) present. No edema.     Cervical back: Normal range of motion and neck supple.     Comments: Patient with midline and left lower back tenderness to palpation and positive left single leg raise.  Bilateral lower back paraspinous muscle spasm  Lymphadenopathy:    He has no cervical adenopathy.  Neurological: He is alert and oriented to person, place, and time.  Skin: Skin is warm and dry.  Psychiatric: He has a normal mood and affect. His behavior is normal.  Slightly flattened affect which may represent depression versus pain  Nursing note and vitals reviewed.   BP (!) 150/98   Pulse 83   Temp 97.7 F (36.5 C) (Temporal)   Ht 5\' 9"  (1.753 m)   Wt 207 lb 6.4 oz (94.1 kg)   SpO2 97%   BMI 30.63 kg/m  Wt Readings from Last 3 Encounters:  11/18/19  207 lb 6.4 oz (94.1  kg)  09/11/18 205 lb (93 kg)  06/26/18 207 lb 8 oz (94.1 kg)     Health Maintenance Due  Topic Date Due  . COVID-19 Vaccine (1) Never done  . TETANUS/TDAP  Never done  . COLONOSCOPY  Never done     Lab Results  Component Value Date   TSH 0.962 05/18/2018   Lab Results  Component Value Date   WBC 7.4 06/26/2018   HGB 12.1 (L) 06/26/2018   HCT 38.7 (L) 06/26/2018   MCV 75.9 (L) 06/26/2018   PLT 234 06/26/2018   Lab Results  Component Value Date   NA 145 (H) 11/18/2019   K 4.4 11/18/2019   CO2 23 11/18/2019   GLUCOSE 105 (H) 11/18/2019   BUN 8 11/18/2019   CREATININE 0.76 11/18/2019   BILITOT 0.5 11/18/2019   ALKPHOS 122 (H) 11/18/2019   AST 64 (H) 11/18/2019   ALT 78 (H) 11/18/2019   PROT 7.3 11/18/2019   ALBUMIN 4.2 11/18/2019   CALCIUM 9.5 11/18/2019   ANIONGAP 7 06/26/2018   Lab Results  Component Value Date   CHOL 167 11/18/2019   Lab Results  Component Value Date   HDL 88 11/18/2019   Lab Results  Component Value Date   LDLCALC 68 11/18/2019   Lab Results  Component Value Date   TRIG 53 11/18/2019   Lab Results  Component Value Date   CHOLHDL 1.9 11/18/2019   Lab Results  Component Value Date   HGBA1C 5.2 05/18/2018      Assessment & Plan:  1. Well adult exam Health maintenance issues discussed with the patient at today's visit.  He agrees to have referral to gastroenterology for screening colonoscopy.  Orders were placed for lipid panel and comprehensive metabolic panel.  Patient initially stated that he did not feel like having blood work done at today's visit due to his current pain.  If he chooses not to have the blood work today, he is aware that he can return at his convenience for fasting blood work.  PSA will also be obtained as a screening test for prostate cancer as patient additionally complains of triggering.  Patient is at increased risk of prostate cancer due to his ethnicity and age. - Lipid panel - Comprehensive metabolic  panel  2. Chronic pain of multiple joints; 3.  Chronic lumbar radiculopathy/low back pain with radiation Pain management referral placed in follow-up of patient's chronic pain of multiple joints right hand pain, occasional left knee pain in a patient with chronic low back pain with radiation/lumbar radiculopathy.  Comprehensive metabolic panel in follow-up of long-term use of over-the-counter and prescription medications for pain.  Referral to pain management. - Comprehensive metabolic panel - Ambulatory referral to Pain Clinic  4. Groin pain, chronic, left Patient with complaint of chronic pain in the left groin status post surgery for spermatocele which was done in December of last year.  He requests referral back to urology for further evaluation and treatment and referral placed at today's visit. - Ambulatory referral to Urology  5. Nocturia Referral to urology for further evaluation and treatment and PSA in follow-up of patient's nocturia. - Ambulatory referral to Urology - PSA  6. Screening for prostate cancer PSA reported as being test for prostate cancer as patient also with complaint of nocturia.  Patient is also African-American which increases his risk for prostate cancer. - PSA  7. Contracture of finger joint, right Patient with complaint of contracture of  the finger on the right hand which is caused him difficulty with holding/grasping objects.  He has been referred to hand surgery for further evaluation and treatment - Ambulatory referral to Hand Surgery  8. Screening for colon cancer Patient does not believe that he has had a baseline colonoscopy and agrees to be referred to gastroenterology for screening colonoscopy - Ambulatory referral to Gastroenterology  9. Retinitis pigmentosa Referral placed to ophthalmologist/retinal specialist in follow-up of patient's retinitis pigmentosa - Ambulatory referral to Ophthalmology  10. Bilateral hand numbness Neurology referral  and follow-up of patient's bilateral hand numbness.  Patient has had normal hemoglobin A1c.  Suspect the patient with carpal tunnel syndrome versus radicular symptoms - Ambulatory referral to Neurology  11. Encounter for long-term current use of medication Comprehensive metabolic panel in follow-up of long-term use of medications - Comprehensive metabolic panel  12. Essential hypertension Refill provided of amlodipine and patient will be scheduled with the clinical pharmacist for visit and follow-up of hypertension.  - amLODipine (NORVASC) 10 MG tablet; Take 1 tablet (10 mg total) by mouth daily. To lower blood pressure  Dispense: 90 tablet; Refill: 1 -lipid panel  13. Anxiety and depression; 14.  Positive depression screen Patient is being referred to social work for further evaluation as well as information on establishing with mental health provider for anxiety and depression and follow-up of abnormal depression screen.  Patient has been doing telephone consults with the social worker and social worker was not available for consultation during today's visit. Patient is not interested in medication for anxiety/depression or psychiatry/mental health referral at this time.  - Ambulatory referral to Social Work   An After Visit Summary was printed and given to the patient.   Follow-up: Return in about 2 months (around 01/18/2020) for chronic issues; 3-4 week BP recheck with CPP/Luke.   Antony Blackbird, MD

## 2019-11-19 LAB — COMPREHENSIVE METABOLIC PANEL WITH GFR
ALT: 78 IU/L — ABNORMAL HIGH (ref 0–44)
AST: 64 IU/L — ABNORMAL HIGH (ref 0–40)
Albumin/Globulin Ratio: 1.4 (ref 1.2–2.2)
Albumin: 4.2 g/dL (ref 3.8–4.9)
Alkaline Phosphatase: 122 IU/L — ABNORMAL HIGH (ref 39–117)
BUN/Creatinine Ratio: 11 (ref 9–20)
BUN: 8 mg/dL (ref 6–24)
Bilirubin Total: 0.5 mg/dL (ref 0.0–1.2)
CO2: 23 mmol/L (ref 20–29)
Calcium: 9.5 mg/dL (ref 8.7–10.2)
Chloride: 104 mmol/L (ref 96–106)
Creatinine, Ser: 0.76 mg/dL (ref 0.76–1.27)
GFR calc Af Amer: 119 mL/min/1.73
GFR calc non Af Amer: 103 mL/min/1.73
Globulin, Total: 3.1 g/dL (ref 1.5–4.5)
Glucose: 105 mg/dL — ABNORMAL HIGH (ref 65–99)
Potassium: 4.4 mmol/L (ref 3.5–5.2)
Sodium: 145 mmol/L — ABNORMAL HIGH (ref 134–144)
Total Protein: 7.3 g/dL (ref 6.0–8.5)

## 2019-11-19 LAB — PSA: Prostate Specific Ag, Serum: 0.7 ng/mL (ref 0.0–4.0)

## 2019-11-19 LAB — LIPID PANEL
Chol/HDL Ratio: 1.9 ratio (ref 0.0–5.0)
Cholesterol, Total: 167 mg/dL (ref 100–199)
HDL: 88 mg/dL
LDL Chol Calc (NIH): 68 mg/dL (ref 0–99)
Triglycerides: 53 mg/dL (ref 0–149)
VLDL Cholesterol Cal: 11 mg/dL (ref 5–40)

## 2019-11-25 ENCOUNTER — Encounter: Payer: Self-pay | Admitting: *Deleted

## 2019-11-26 ENCOUNTER — Encounter: Payer: Self-pay | Admitting: Gastroenterology

## 2019-12-02 ENCOUNTER — Encounter: Payer: Self-pay | Admitting: Family Medicine

## 2019-12-10 ENCOUNTER — Ambulatory Visit: Payer: Self-pay | Admitting: Pharmacist

## 2019-12-19 ENCOUNTER — Encounter: Payer: Self-pay | Admitting: Pharmacist

## 2019-12-19 ENCOUNTER — Other Ambulatory Visit: Payer: Self-pay

## 2019-12-19 ENCOUNTER — Ambulatory Visit: Payer: Medicaid Other | Attending: Family Medicine | Admitting: Pharmacist

## 2019-12-19 ENCOUNTER — Other Ambulatory Visit: Payer: Self-pay | Admitting: Family Medicine

## 2019-12-19 DIAGNOSIS — I10 Essential (primary) hypertension: Secondary | ICD-10-CM

## 2019-12-19 MED ORDER — AMLODIPINE BESYLATE 10 MG PO TABS: 10.0000 mg | ORAL_TABLET | Freq: Every day | ORAL | 2 refills | Status: DC

## 2019-12-19 NOTE — Progress Notes (Signed)
   S:  PCP: Dr. Chapman Fitch     Patient arrives in good spirits. Presents to the clinic for hypertension evaluation, counseling, and management.  Patient was referred and last seen by Primary Care Provider on 11/18/19.    Medication adherence: pt reports frequently missing amlodipine doses. He has not tried alarms or pill boxes. He does not take chlorthalidone.   Current BP Medications include:  Amlodipine 10 mg daily, chlorthalidone 12.5 mg daily   Dietary habits include: reports compliance with salt restriction; denies drinking caffeine  Exercise habits include: reports that he started upper body training yesterday but does not engage in aerobic exercise due to groin Family / Social history:  - FHx: HTN - Tobacco: current some day smoker; reports that 1 pack will last 2-3 days - Alcohol: drinks ~32 oz daily   O:  Vitals:   12/19/19 0945  BP: 128/88   Home BP readings: none  Last 3 Office BP readings: BP Readings from Last 3 Encounters:  12/19/19 128/88  11/18/19 (!) 150/98  09/11/18 (!) 153/111   BMET    Component Value Date/Time   NA 145 (H) 11/18/2019 1208   K 4.4 11/18/2019 1208   CL 104 11/18/2019 1208   CO2 23 11/18/2019 1208   GLUCOSE 105 (H) 11/18/2019 1208   GLUCOSE 91 06/26/2018 0800   BUN 8 11/18/2019 1208   CREATININE 0.76 11/18/2019 1208   CREATININE 0.90 10/29/2014 1157   CALCIUM 9.5 11/18/2019 1208   GFRNONAA 103 11/18/2019 1208   GFRNONAA >89 10/29/2014 1157   GFRAA 119 11/18/2019 1208   GFRAA >89 10/29/2014 1157    Renal function: CrCl cannot be calculated (Patient's most recent lab result is older than the maximum 21 days allowed.).  Clinical ASCVD: No  The 10-year ASCVD risk score Mikey Bussing DC Jr., et al., 2013) is: 8.8%   Values used to calculate the score:     Age: 54 years     Sex: Male     Is Non-Hispanic African American: Yes     Diabetic: No     Tobacco smoker: No     Systolic Blood Pressure: 242 mmHg     Is BP treated: Yes     HDL  Cholesterol: 88 mg/dL     Total Cholesterol: 167 mg/dL  A/P: Hypertension longstanding currently improved on current medications. BP Goal = < 130/80 mmHg. Medication adherence: patient frequently misses amlodipine and does not take chlorthalidone. Even with this, his BP today is improved from his previous visit. I have instructed him to take amlodipine daily. Methods to improve adherence discussed. He will return in two weeks for recheck.   -Continued current regimen. -Counseled on lifestyle modifications for blood pressure control including reduced dietary sodium, increased exercise, adequate sleep.  Results reviewed and written information provided.   Total time in face-to-face counseling 15 minutes.   F/U Clinic Visit in 2 weeks.   Benard Halsted, PharmD, [place] [phone]

## 2020-01-02 ENCOUNTER — Encounter: Payer: Self-pay | Admitting: Pharmacist

## 2020-01-02 ENCOUNTER — Other Ambulatory Visit: Payer: Self-pay

## 2020-01-02 ENCOUNTER — Ambulatory Visit: Payer: Medicaid Other | Attending: Family Medicine | Admitting: Pharmacist

## 2020-01-02 ENCOUNTER — Other Ambulatory Visit: Payer: Self-pay | Admitting: Family Medicine

## 2020-01-02 DIAGNOSIS — I10 Essential (primary) hypertension: Secondary | ICD-10-CM | POA: Diagnosis not present

## 2020-01-02 MED ORDER — CHLORTHALIDONE 25 MG PO TABS: 12.5000 mg | ORAL_TABLET | Freq: Every day | ORAL | 4 refills | Status: DC

## 2020-01-02 MED FILL — CHLORTHALIDONE 25 MG TAB: 25 | 30 days supply | Qty: 15 | Fill #0

## 2020-01-02 NOTE — Progress Notes (Signed)
   S:  PCP: Dr. Chapman Fitch     Patient arrives in good spirits. Presents to the clinic for hypertension evaluation, counseling, and management.  Patient was referred and last seen by Primary Care Provider on 11/18/19. I saw him on 12/19/19 but made no med changes.   Medication adherence: reported with amlodipine.   Current BP Medications include:  Amlodipine 10 mg daily, chlorthalidone 12.5 mg daily (not taking)  Dietary habits include: reports compliance with salt restriction; denies drinking caffeine  Exercise habits include: reports that he started upper body training yesterday but does not engage in aerobic exercise due to groin Family / Social history:  - FHx: HTN - Tobacco: current some day smoker; reports that 1 pack will last 2-3 days - Alcohol: drinks ~32 oz daily   O:  Vitals:   01/02/20 1121  BP: (!) 134/95   Home BP readings: none  Last 3 Office BP readings: BP Readings from Last 3 Encounters:  01/02/20 (!) 134/95  12/19/19 128/88  11/18/19 (!) 150/98   BMET    Component Value Date/Time   NA 145 (H) 11/18/2019 1208   K 4.4 11/18/2019 1208   CL 104 11/18/2019 1208   CO2 23 11/18/2019 1208   GLUCOSE 105 (H) 11/18/2019 1208   GLUCOSE 91 06/26/2018 0800   BUN 8 11/18/2019 1208   CREATININE 0.76 11/18/2019 1208   CREATININE 0.90 10/29/2014 1157   CALCIUM 9.5 11/18/2019 1208   GFRNONAA 103 11/18/2019 1208   GFRNONAA >89 10/29/2014 1157   GFRAA 119 11/18/2019 1208   GFRAA >89 10/29/2014 1157    Renal function: CrCl cannot be calculated (Patient's most recent lab result is older than the maximum 21 days allowed.).  Clinical ASCVD: No  The 10-year ASCVD risk score Mikey Bussing DC Jr., et al., 2013) is: 9.6%   Values used to calculate the score:     Age: 73 years     Sex: Male     Is Non-Hispanic African American: Yes     Diabetic: No     Tobacco smoker: No     Systolic Blood Pressure: 361 mmHg     Is BP treated: Yes     HDL Cholesterol: 88 mg/dL     Total  Cholesterol: 167 mg/dL  A/P: Hypertension longstanding currently above goal on current medications. BP Goal = < 130/80 mmHg. Medication adherence reported. Will have patient restart chlorthalidone 12.5 mg daily.    -Continued current regimen. -Counseled on lifestyle modifications for blood pressure control including reduced dietary sodium, increased exercise, adequate sleep.  Results reviewed and written information provided.   Total time in face-to-face counseling 15 minutes.   F/U Clinic Visit in 2 weeks.   Benard Halsted, PharmD, [place] [phone]

## 2020-01-09 ENCOUNTER — Telehealth: Payer: Self-pay | Admitting: Licensed Clinical Social Worker

## 2020-01-09 ENCOUNTER — Telehealth: Payer: Self-pay | Admitting: *Deleted

## 2020-01-09 NOTE — Telephone Encounter (Signed)
Call placed to patient regarding [facility] referral. LCSW introduced self and explained role at [facility]. Pt agreed to meet with LCSW after scheduled appointment with CPP.

## 2020-01-09 NOTE — Telephone Encounter (Signed)
Opened in error

## 2020-01-16 ENCOUNTER — Ambulatory Visit: Payer: Medicaid Other | Admitting: Pharmacist

## 2020-01-16 ENCOUNTER — Institutional Professional Consult (permissible substitution): Payer: Medicaid Other | Admitting: Licensed Clinical Social Worker

## 2020-01-20 ENCOUNTER — Ambulatory Visit: Payer: Self-pay | Admitting: Family Medicine

## 2020-01-23 ENCOUNTER — Encounter: Payer: Self-pay | Admitting: Gastroenterology

## 2020-02-06 ENCOUNTER — Other Ambulatory Visit: Payer: Self-pay | Admitting: Physician Assistant

## 2020-02-06 ENCOUNTER — Other Ambulatory Visit: Payer: Self-pay

## 2020-02-06 ENCOUNTER — Ambulatory Visit: Payer: Medicaid Other | Attending: Family Medicine | Admitting: Physician Assistant

## 2020-02-06 DIAGNOSIS — R200 Anesthesia of skin: Secondary | ICD-10-CM

## 2020-02-06 DIAGNOSIS — I10 Essential (primary) hypertension: Secondary | ICD-10-CM | POA: Diagnosis not present

## 2020-02-06 DIAGNOSIS — M549 Dorsalgia, unspecified: Secondary | ICD-10-CM | POA: Diagnosis not present

## 2020-02-06 MED ORDER — NAPROXEN 500 MG PO TABS: 500.0000 mg | ORAL_TABLET | Freq: Two times a day (BID) | ORAL | 5 refills | Status: DC

## 2020-02-06 MED ORDER — GABAPENTIN 300 MG PO CAPS: 600.0000 mg | ORAL_CAPSULE | Freq: Three times a day (TID) | ORAL | 5 refills | Status: DC

## 2020-02-06 MED ORDER — AMLODIPINE BESYLATE 10 MG PO TABS: 10.0000 mg | ORAL_TABLET | Freq: Every day | ORAL | 2 refills | Status: DC

## 2020-02-06 MED FILL — AMLODIPINE BESYLATE 10 MG T: 10 | 30 days supply | Qty: 30 | Fill #0

## 2020-02-06 MED FILL — NAPROXEN 500 MG TABLET: 500 | 30 days supply | Qty: 60 | Fill #0

## 2020-02-06 MED FILL — GABAPENTIN 300 MG CAPSULE: 300 | 30 days supply | Qty: 180 | Fill #0

## 2020-02-06 NOTE — Progress Notes (Signed)
Patient ID: Darren Larsen, male   DOB: 05/08/1966, 54 y.o.   MRN: [id number] Virtual Visit via Telephone Note  I connected with Darren Larsen on 02/06/20 at  8:50 AM EDT by telephone and verified that I am speaking with the correct person using two identifiers.   I discussed the limitations, risks, security and privacy concerns of performing an evaluation and management service by telephone and the availability of in person appointments. I also discussed with the patient that there may be a patient responsible charge related to this service. The patient expressed understanding and agreed to proceed.  PATIENT visit by telephone virtually in the context of Covid-19 pandemic. Patient location:  home My Location:  [place] office Persons on the call: me and the patient  History of Present Illness:  Pain in L leg since surgery to remove a cyst on his testicle about 2 years ago.  On gabapentin 300mg  tid but it's no longer controlling the pain.  Also needs RF on amlodipine and naproxen.      Observations/Objective: NAD.  A&Ox3  Assessment and Plan: 1. Essential hypertension Stable-continue - amLODipine (NORVASC) 10 MG tablet; Take 1 tablet (10 mg total) by mouth daily. To lower blood pressure  Dispense: 30 tablet; Refill: 2  2. Bilateral hand numbness - gabapentin (NEURONTIN) 300 MG capsule; Take 2 capsules (600 mg total) by mouth 3 (three) times daily. For nerve pain  Dispense: 180 capsule; Refill: 5  3. Back pain with radiation in L leg -increase dose of gabapentin to 600mg  tid - gabapentin (NEURONTIN) 300 MG capsule; Take 2 capsules (600 mg total) by mouth 3 (three) times daily. For nerve pain  Dispense: 180 capsule; Refill: 5 - naproxen (NAPROSYN) 500 MG tablet; Take 1 tablet (500 mg total) by mouth 2 (two) times daily with a meal. As needed for pain  Dispense: 60 tablet; Refill: 5    Follow Up Instructions: See PCP in 2-3 months.  Labs in 11/2019 done   I discussed the assessment and  treatment plan with the patient. The patient was provided an opportunity to ask questions and all were answered. The patient agreed with the plan and demonstrated an understanding of the instructions.   The patient was advised to call back or seek an in-person evaluation if the symptoms worsen or if the condition fails to improve as anticipated.  I provided 9 minutes of non-face-to-face time during this encounter.   Freeman Caldron, PA-C

## 2020-02-21 ENCOUNTER — Other Ambulatory Visit: Payer: Self-pay | Admitting: Family Medicine

## 2020-02-21 DIAGNOSIS — I10 Essential (primary) hypertension: Secondary | ICD-10-CM

## 2020-02-21 DIAGNOSIS — R200 Anesthesia of skin: Secondary | ICD-10-CM

## 2020-02-21 DIAGNOSIS — M549 Dorsalgia, unspecified: Secondary | ICD-10-CM

## 2020-02-21 DIAGNOSIS — Z791 Long term (current) use of non-steroidal anti-inflammatories (NSAID): Secondary | ICD-10-CM

## 2020-02-21 MED FILL — AMLODIPINE BESYLATE 10 MG T: 10 | 30 days supply | Qty: 30 | Fill #0

## 2020-02-21 MED FILL — GABAPENTIN 300 MG CAPSULE: 300 | 30 days supply | Qty: 180 | Fill #0

## 2020-02-21 MED FILL — OMEPRAZOLE 20 MG CAP: 20 | 30 days supply | Qty: 30 | Fill #0

## 2020-02-21 MED FILL — NAPROXEN 500 MG TABLET: 500 | 30 days supply | Qty: 60 | Fill #0

## 2020-02-21 MED FILL — CHLORTHALIDONE 25 MG TAB: 25 | 30 days supply | Qty: 15 | Fill #1

## 2020-02-21 NOTE — Telephone Encounter (Signed)
Patient already has refills on medication at pharmacy. Patient currently at pharmacy waiting to get medication

## 2020-02-21 NOTE — Telephone Encounter (Signed)
Medication Refill - Medication: amLODipine (NORVASC) 10 MG tablet gabapentin (NEURONTIN) 300 MG capsule naproxen (NAPROSYN) 500 MG tablet     Preferred Pharmacy (with phone number or street name):  [place], [place] Phone:  [phone]  Fax:  [phone]       Agent: Please be advised that RX refills may take up to 3 business days. We ask that you follow-up with your pharmacy.

## 2020-05-01 MED FILL — NAPROXEN 500 MG TABLET: 500 | 30 days supply | Qty: 60 | Fill #1

## 2020-05-01 MED FILL — CHLORTHALIDONE 25 MG TAB: 25 | 30 days supply | Qty: 15 | Fill #2

## 2020-05-01 MED FILL — AMLODIPINE BESYLATE 10 MG T: 10 | 30 days supply | Qty: 30 | Fill #1

## 2020-05-01 MED FILL — GABAPENTIN 300 MG CAPSULE: 300 | 30 days supply | Qty: 180 | Fill #1

## 2020-07-20 MED FILL — CHLORTHALIDONE 25 MG TAB: 25 | 30 days supply | Qty: 15 | Fill #3

## 2020-07-20 MED FILL — GABAPENTIN 300 MG CAPSULE: 300 | 30 days supply | Qty: 180 | Fill #2

## 2020-07-20 MED FILL — NAPROXEN 500 MG TABLET: 500 | 30 days supply | Qty: 60 | Fill #2

## 2020-07-20 MED FILL — AMLODIPINE BESYLATE 10 MG T: 10 | 30 days supply | Qty: 30 | Fill #2
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
## 2020-09-29 ENCOUNTER — Other Ambulatory Visit: Payer: Self-pay | Admitting: Family Medicine

## 2020-09-29 DIAGNOSIS — M62830 Muscle spasm of back: Secondary | ICD-10-CM

## 2020-09-29 DIAGNOSIS — G8929 Other chronic pain: Secondary | ICD-10-CM

## 2020-09-29 MED FILL — NAPROXEN 500 MG TABLET: 500 | 30 days supply | Qty: 60 | Fill #3

## 2020-09-29 MED FILL — GABAPENTIN 300 MG CAPSULE: 300 | 30 days supply | Qty: 180 | Fill #3

## 2020-09-29 NOTE — Telephone Encounter (Signed)
Requested medication (s) are due for refill today: no  Requested medication (s) are on the active medication list: yes  Last refill:  07/30/2019  Future visit scheduled:no  Notes to clinic:  this refill cannot be delegated    Requested Prescriptions  Pending Prescriptions Disp Refills   methocarbamol (ROBAXIN) 500 MG tablet [Pharmacy Med Name: METHOCARBAMOL 500 MG TABS 500 Tablet] 60 tablet 0    Sig: TAKE 1 TABLET (500 MG TOTAL) BY MOUTH 2 (TWO) TIMES DAILY.      Not Delegated - Analgesics:  Muscle Relaxants Failed - 09/29/2020  3:07 PM      Failed - This refill cannot be delegated      Failed - Valid encounter within last 6 months    Recent Outpatient Visits           7 months ago Essential hypertension   [facility], [place], [place]   9 months ago Essential hypertension   [place], Jarome Matin, RPH-CPP   9 months ago Essential hypertension   [place], Stephen L, RPH-CPP   10 months ago Well adult exam   [place], [place]   1 year ago Back pain with radiation   [address], [place], [place]

## 2020-10-02 ENCOUNTER — Other Ambulatory Visit: Payer: Self-pay | Admitting: Family Medicine

## 2020-10-02 ENCOUNTER — Other Ambulatory Visit: Payer: Self-pay | Admitting: Pharmacist

## 2020-10-02 DIAGNOSIS — Z791 Long term (current) use of non-steroidal anti-inflammatories (NSAID): Secondary | ICD-10-CM

## 2020-10-02 DIAGNOSIS — I10 Essential (primary) hypertension: Secondary | ICD-10-CM

## 2020-10-02 MED ORDER — OMEPRAZOLE 20 MG PO CPDR: 20.0000 mg | DELAYED_RELEASE_CAPSULE | Freq: Every day | ORAL | 0 refills | Status: DC

## 2020-10-02 MED ORDER — AMLODIPINE BESYLATE 10 MG PO TABS: 10.0000 mg | ORAL_TABLET | Freq: Every day | ORAL | 0 refills | Status: DC

## 2020-10-02 MED ORDER — CHLORTHALIDONE 25 MG PO TABS: 12.5000 mg | ORAL_TABLET | Freq: Every day | ORAL | 0 refills | Status: DC

## 2020-10-02 MED FILL — OMEPRAZOLE 20 MG CAP: 20 | 30 days supply | Qty: 30 | Fill #0

## 2020-10-02 MED FILL — AMLODIPINE BESYLATE 10 MG T: 10 | 30 days supply | Qty: 30 | Fill #0

## 2020-10-02 MED FILL — CHLORTHALIDONE 25 MG TAB: 25 | 30 days supply | Qty: 15 | Fill #0

## 2020-10-28 ENCOUNTER — Other Ambulatory Visit: Payer: Self-pay

## 2020-11-24 ENCOUNTER — Other Ambulatory Visit: Payer: Self-pay

## 2020-11-24 ENCOUNTER — Ambulatory Visit: Payer: Medicaid Other | Attending: Critical Care Medicine | Admitting: Critical Care Medicine

## 2020-11-24 ENCOUNTER — Encounter: Payer: Self-pay | Admitting: Critical Care Medicine

## 2020-11-24 VITALS — BP 131/82 | HR 97 | Ht 69.0 in | Wt 241.6 lb

## 2020-11-24 DIAGNOSIS — M5442 Lumbago with sciatica, left side: Secondary | ICD-10-CM

## 2020-11-24 DIAGNOSIS — I10 Essential (primary) hypertension: Secondary | ICD-10-CM

## 2020-11-24 DIAGNOSIS — Z1211 Encounter for screening for malignant neoplasm of colon: Secondary | ICD-10-CM

## 2020-11-24 DIAGNOSIS — G8929 Other chronic pain: Secondary | ICD-10-CM | POA: Diagnosis not present

## 2020-11-24 DIAGNOSIS — M25511 Pain in right shoulder: Secondary | ICD-10-CM

## 2020-11-24 DIAGNOSIS — Z139 Encounter for screening, unspecified: Secondary | ICD-10-CM

## 2020-11-24 DIAGNOSIS — M62830 Muscle spasm of back: Secondary | ICD-10-CM | POA: Diagnosis not present

## 2020-11-24 DIAGNOSIS — Z1159 Encounter for screening for other viral diseases: Secondary | ICD-10-CM

## 2020-11-24 DIAGNOSIS — M25552 Pain in left hip: Secondary | ICD-10-CM | POA: Insufficient documentation

## 2020-11-24 MED ORDER — NAPROXEN 500 MG PO TABS
ORAL_TABLET | ORAL | 5 refills | Status: DC
  Filled 2020-11-24: qty 60, 30d supply, fill #0
  Filled 2021-01-15: qty 60, 30d supply, fill #1
  Filled 2021-02-25: qty 60, 30d supply, fill #2
  Filled 2021-04-13: qty 60, 30d supply, fill #3

## 2020-11-24 MED ORDER — AMLODIPINE BESYLATE 10 MG PO TABS
10.0000 mg | ORAL_TABLET | Freq: Every day | ORAL | 1 refills | Status: DC
  Filled 2020-11-24: qty 30, 30d supply, fill #0
  Filled 2021-01-15: qty 30, 30d supply, fill #1
  Filled 2021-02-25: qty 30, 30d supply, fill #2
  Filled 2021-04-20: qty 30, 30d supply, fill #3

## 2020-11-24 MED ORDER — CHLORTHALIDONE 25 MG PO TABS
25.0000 mg | ORAL_TABLET | Freq: Every day | ORAL | 1 refills | Status: DC
  Filled 2020-11-24: qty 30, 30d supply, fill #0
  Filled 2021-01-15: qty 30, 30d supply, fill #1
  Filled 2021-02-25: qty 30, 30d supply, fill #2

## 2020-11-24 MED ORDER — GABAPENTIN 300 MG PO CAPS
ORAL_CAPSULE | ORAL | 5 refills | Status: DC
  Filled 2020-11-24: qty 180, 30d supply, fill #0
  Filled 2021-01-15: qty 180, 30d supply, fill #1
  Filled 2021-02-25: qty 180, 30d supply, fill #2
  Filled 2021-04-13: qty 180, 30d supply, fill #3

## 2020-11-24 MED ORDER — METHOCARBAMOL 500 MG PO TABS
1.0000 | ORAL_TABLET | Freq: Four times a day (QID) | ORAL | 2 refills | Status: DC | PRN
  Filled 2020-11-24: qty 120, 30d supply, fill #0

## 2020-11-24 MED ORDER — OMEPRAZOLE 20 MG PO CPDR
DELAYED_RELEASE_CAPSULE | ORAL | 3 refills | Status: DC
  Filled 2020-11-24: qty 30, 30d supply, fill #0
  Filled 2021-01-15: qty 30, 30d supply, fill #1
  Filled 2021-02-25: qty 30, 30d supply, fill #2
  Filled 2021-04-20: qty 30, 30d supply, fill #3

## 2020-11-24 MED ORDER — OMEGA-3 1000 MG PO CAPS
2000.0000 mg | ORAL_CAPSULE | Freq: Every day | ORAL | 2 refills | Status: DC
  Filled 2020-11-24: qty 60, 30d supply, fill #0

## 2020-11-24 MED ORDER — VITAMIN B-1 100 MG PO TABS
100.0000 mg | ORAL_TABLET | Freq: Every day | ORAL | 6 refills | Status: DC
  Filled 2020-11-24: qty 30, 30d supply, fill #0

## 2020-11-24 NOTE — Assessment & Plan Note (Signed)
Hypertension well controlled we will refill amlodipine and increase chlorthalidone to 25 mg daily we will repeat and assess renal function and blood counts this is

## 2020-11-24 NOTE — Patient Instructions (Signed)
Refills on your medication sent to her pharmacy  Methocarbamol 4 times a day as needed's for muscle spasms Take Naprosyn twice daily for pain Take the gabapentin 2 capsules 3 times daily for nerve pain in the left hip and leg and back Take the omeprazole daily for acid in the stomach  Amlodipine and chlorthalidone are 1 daily each for blood pressure  Referral to gastroenterology made for colon cancer screening  Please obtain complete set of blood draws for health screenings  X-rays of the hip and lower back will be obtained go to [facility] for this at your convenience  Focus on smoking cessation completely  Please consider getting a dental appointment dental resources given to you  Return to see Dr. Joya Gaskins in 2 months

## 2020-11-24 NOTE — Progress Notes (Signed)
Needs medication refills. Having pain in left hip and knee.

## 2020-11-24 NOTE — Progress Notes (Signed)
Subjective:    Patient ID: Darren Larsen, male    DOB: 08/19/1965, 55 y.o.   MRN: [id number]  55 y.o.M here to re-est care Former [facility] Patient  11/24/2020 This is a former follow-up patient establishing for primary care last seen July 2021 as documented below Last seen in clinic 01/2020 with telehealth visit [place]: Essential hypertension Stable-continue - amLODipine (NORVASC) 10 MG tablet; Take 1 tablet (10 mg total) by mouth daily. To lower blood pressure  Dispense: 30 tablet; Refill: 2  2. Bilateral hand numbness - gabapentin (NEURONTIN) 300 MG capsule; Take 2 capsules (600 mg total) by mouth 3 (three) times daily. For nerve pain  Dispense: 180 capsule; Refill: 5  3. Back pain with radiation in L leg -increase dose of gabapentin to 600mg  tid - gabapentin (NEURONTIN) 300 MG capsule; Take 2 capsules (600 mg total) by mouth 3 (three) times daily. For nerve pain  Dispense: 180 capsule; Refill: 5 - naproxen (NAPROSYN) 500 MG tablet; Take 1 tablet (500 mg total) by mouth 2 (two) times daily with a meal. As needed for pain  Dispense: 60 tablet; Refill: 5  Patient states he has been compliant with amlodipine and chlorthalidone.  On arrival blood pressure 131/82.  Known history of hypertension and also chronic low back pain.  Also the patient had a spermatocele of the testicle which was operated on in 2019.  Patient states he still has chronic low back pain and left hip pain with radiation down to the left knee.  He has difficulty ambulating and is now using a cane.  He states the gabapentin and Naprosyn helped but he is ran out of these medications.  Patient is wishing to establish care for care does have full Medicaid.  Note the patient declined to receive COVID vaccination understands the risk.  Patient does need colonoscopy.  He had a Tdap 7 years ago.  No other real complaints at this time.  Patient is due for complete repeat lab screen  Past Medical History:  Diagnosis Date  . Anxiety    . Arthritis    hands  . History of concussion    per pt concussion without LOC 1980s -- no residuals  . History of seizure    06-21-2018  per pt was told over 30 yrs ago had a seizure while sleeping  . Hypertension   . Retinitis pigmentosa congenital   . Spermatocele    left  . Spermatocele of epididymis, unspecified 04/17/2018  . Wears glasses      Family History  Problem Relation Age of Onset  . Hypertension Mother      Social History   Socioeconomic History  . Marital status: Legally Separated    Spouse name: Not on file  . Number of children: 5  . Years of education: 86   . Highest education level: Not on file  Occupational History  . Occupation: Biomedical engineer   . Occupation: Ambulance person   Tobacco Use  . Smoking status: Former Smoker    Packs/day: 0.50    Years: 20.00    Pack years: 10.00    Types: Cigarettes    Quit date: 06/14/2018    Years since quitting: 2.4  . Smokeless tobacco: Never Used  . Tobacco comment: recently stopped smoking approx. 06-14-2018  Vaping Use  . Vaping Use: Never used  Substance and Sexual Activity  . Alcohol use: Yes    Comment: per pt average 32 oz beer per day  . Drug use: Yes  Types: Marijuana    Comment: 06-21-2018 last marijuana per pt 3 wks ago  . Sexual activity: Not on file  Other Topics Concern  . Not on file  Social History Narrative   Live with niece   Children grown and gone.   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    Social Determinants of Health   Financial Resource Strain: Not on file  Food Insecurity: Not on file  Transportation Needs: Not on file  Physical Activity: Not on file  Stress: Not on file  Social Connections: Not on file  Intimate Partner Violence: Not on file     Allergies  Allergen Reactions  . Aspirin Other (See Comments)    NOSE BLEED     Outpatient Medications Prior to Visit  Medication Sig Dispense Refill  .  acetaminophen (TYLENOL) 500 MG tablet Take 1 tablet (500 mg total) by mouth every 6 (six) hours as needed. 30 tablet 0  . amLODipine (NORVASC) 10 MG tablet Take 1 tablet (10 mg total) by mouth daily. To lower blood pressure 30 tablet 0  . amLODipine (NORVASC) 10 MG tablet TAKE 1 TABLET (10 MG TOTAL) BY MOUTH DAILY. TO LOWER BLOOD PRESSURE (Patient taking differently: Take by mouth daily. to lower blood pressure) 30 tablet 0  . amLODipine (NORVASC) 10 MG tablet TAKE 1 TABLET (10 MG TOTAL) BY MOUTH DAILY. TO LOWER BLOOD PRESSURE 30 tablet 2  . Aspirin-Acetaminophen-Caffeine (GOODYS EXTRA STRENGTH) 520-260-32.5 MG PACK Take 1 packet by mouth 2 (two) times daily as needed (for pain.).    Marland Kitchen chlorthalidone (HYGROTON) 25 MG tablet Take 0.5 tablets (12.5 mg total) by mouth daily. 15 tablet 0  . chlorthalidone (HYGROTON) 25 MG tablet TAKE 0.5 TABLETS (12.5 MG TOTAL) BY MOUTH DAILY. 15 tablet 0  . chlorthalidone (HYGROTON) 25 MG tablet TAKE 0.5 TABLETS (12.5 MG TOTAL) BY MOUTH DAILY. 15 tablet 4  . diclofenac sodium (VOLTAREN) 1 % GEL appply 4 gm to shoulder area up to 4 times per day as needed for pain (Patient not taking: Reported on 12/12/2018) 2 Tube 4  . gabapentin (NEURONTIN) 300 MG capsule Take 2 capsules (600 mg total) by mouth 3 (three) times daily. For nerve pain 180 capsule 5  . gabapentin (NEURONTIN) 300 MG capsule TAKE 2 CAPSULES (600 MG TOTAL) BY MOUTH 3 (THREE) TIMES DAILY. FOR NERVE PAIN 180 capsule 5  . methocarbamol (ROBAXIN) 500 MG tablet TAKE 1 TABLET (500 MG TOTAL) BY MOUTH 2 (TWO) TIMES DAILY. 60 tablet 0  . naproxen (NAPROSYN) 500 MG tablet Take 1 tablet (500 mg total) by mouth 2 (two) times daily with a meal. As needed for pain 60 tablet 5  . naproxen (NAPROSYN) 500 MG tablet TAKE 1 TABLET (500 MG TOTAL) BY MOUTH 2 (TWO) TIMES DAILY WITH A MEAL. AS NEEDED FOR PAIN 60 tablet 5  . Omega-3 1000 MG CAPS Take 2,000 mg by mouth daily.     Marland Kitchen omeprazole (PRILOSEC) 20 MG capsule Take 1 capsule (20  mg total) by mouth daily. For stomach protection 30 capsule 0  . omeprazole (PRILOSEC) 20 MG capsule TAKE 1 CAPSULE (20 MG TOTAL) BY MOUTH DAILY. FOR STOMACH PROTECTION 30 capsule 0  . Ophthalmic Irrigation Solution (EYE WASH OP) Place 1 drop into both eyes 4 (four) times daily as needed (excessive dry eyes.).     Marland Kitchen thiamine (VITAMIN B-1) 100 MG tablet Take 1 tablet (100  mg total) by mouth daily. (Patient not taking: Reported on 12/12/2018) 30 tablet 6   No facility-administered medications prior to visit.      Review of Systems  HENT: Negative.   Respiratory: Negative.   Cardiovascular: Negative.   Gastrointestinal: Negative.   Genitourinary: Negative.   Musculoskeletal: Positive for back pain and gait problem.  Skin: Negative.   Psychiatric/Behavioral: Negative.        Objective:   Physical Exam  Vitals:   11/24/20 1032  BP: 131/82  Pulse: 97  SpO2: 97%  Weight: 241 lb 9.6 oz (109.6 kg)  Height: 5\' 9"  (1.753 m)    Gen: Pleasant, well-nourished, in no distress,  normal affect  ENT: No lesions,  mouth clear,  oropharynx clear, no postnasal drip  Neck: No JVD, no TMG, no carotid bruits  Lungs: No use of accessory muscles, no dullness to percussion, clear without rales or rhonchi  Cardiovascular: RRR, heart sounds normal, no murmur or gallops, no peripheral edema  Abdomen: soft and NT, no HSM,  BS normal  Musculoskeletal: No deformities, no cyanosis or clubbing  Neuro: alert, non focal  Skin: Warm, no lesions or rashes        Assessment & Plan:  I personally reviewed all images and lab data in the [facility] system as well as any outside material available during this office visit and agree with the  radiology impressions.   HTN (hypertension) Hypertension well controlled we will refill amlodipine and increase chlorthalidone to 25 mg daily we will repeat and assess renal function and blood counts this is  Chronic bilateral low back pain with left-sided  sciatica Chronic lower extremity pain chronic low back pain bilateral with left-sided predominance left hip pain as well  Resume gabapentin 600 mg 3 times daily Naprosyn twice daily 500 mg  Image both hip and lower spine   Derek was seen today for hypertension.  Diagnoses and all orders for this visit:  Chronic bilateral low back pain with left-sided sciatica -     DG Lumbar Spine Complete; Future -     DG HIP UNILAT WITH PELVIS 2-3 VIEWS RIGHT; Future  Chronic right shoulder pain -     methocarbamol (ROBAXIN) 500 MG tablet; Take 1 tablet (500 mg total) by mouth every 6 (six) hours as needed for muscle spasms.  Muscle spasm of back -     methocarbamol (ROBAXIN) 500 MG tablet; Take 1 tablet (500 mg total) by mouth every 6 (six) hours as needed for muscle spasms.  Need for hepatitis C screening test -     HCV Ab w Reflex to Quant PCR  Left hip pain -     DG HIP UNILAT WITH PELVIS 2-3 VIEWS RIGHT; Future  Colon cancer screening -     Ambulatory referral to Gastroenterology  Primary hypertension -     Comprehensive metabolic panel -     CBC with Differential/Platelet  Encounter for health-related screening -     Hemoglobin A1c  Other orders -     amLODipine (NORVASC) 10 MG tablet; Take 1 tablet (10 mg total) by mouth daily. to lower blood pressure -     chlorthalidone (HYGROTON) 25 MG tablet; Take 1 tablet (25 mg total) by mouth daily. -     gabapentin (NEURONTIN) 300 MG capsule; TAKE 2 CAPSULES (600 MG TOTAL) BY MOUTH 3 (THREE) TIMES DAILY. FOR NERVE PAIN -     naproxen (NAPROSYN) 500 MG tablet; TAKE 1 TABLET (500 MG TOTAL) BY MOUTH  2 (TWO) TIMES DAILY WITH A MEAL. AS NEEDED FOR PAIN -     Omega-3 1000 MG CAPS; Take 2 capsules (2,000 mg total) by mouth daily. -     omeprazole (PRILOSEC) 20 MG capsule; TAKE 1 CAPSULE (20 MG TOTAL) BY MOUTH DAILY. FOR STOMACH PROTECTION -     thiamine (VITAMIN B-1) 100 MG tablet; Take 1 tablet (100 mg total) by mouth daily.  Refills of  reflux medicine obtained will also send the patient to gastroenterology for colonoscopy check hemoglobin A1c and hepatitis C assay  I spent 26 minutes on this patient reviewing his old records examining him moderate complexity of decision

## 2020-11-24 NOTE — Assessment & Plan Note (Signed)
Chronic lower extremity pain chronic low back pain bilateral with left-sided predominance left hip pain as well  Resume gabapentin 600 mg 3 times daily Naprosyn twice daily 500 mg  Image both hip and lower spine

## 2021-01-15 ENCOUNTER — Ambulatory Visit (HOSPITAL_COMMUNITY)
Admission: RE | Admit: 2021-01-15 | Discharge: 2021-01-15 | Disposition: A | Discharge status: Home / Self Care | Payer: Medicaid Other | Source: Ambulatory Visit | Attending: Critical Care Medicine | Admitting: Critical Care Medicine

## 2021-01-15 ENCOUNTER — Encounter (HOSPITAL_COMMUNITY): Payer: Self-pay

## 2021-01-15 ENCOUNTER — Other Ambulatory Visit: Payer: Self-pay

## 2021-01-15 DIAGNOSIS — M5442 Lumbago with sciatica, left side: Secondary | ICD-10-CM

## 2021-01-15 DIAGNOSIS — G8929 Other chronic pain: Secondary | ICD-10-CM

## 2021-01-15 DIAGNOSIS — M25552 Pain in left hip: Secondary | ICD-10-CM

## 2021-01-19 ENCOUNTER — Telehealth: Payer: Self-pay | Admitting: Critical Care Medicine

## 2021-01-19 DIAGNOSIS — M25552 Pain in left hip: Secondary | ICD-10-CM

## 2021-01-19 NOTE — Telephone Encounter (Signed)
Pt with bilateral hip arthritis. Referral to orthopedics made

## 2021-01-19 NOTE — Telephone Encounter (Signed)
-----   Message from Arabella Merles, RN sent at 01/18/2021  5:43 PM EDT ----- Pt would like to be referred to ortho.

## 2021-01-24 NOTE — Progress Notes (Signed)
Subjective:    Patient ID: Darren Larsen, male    DOB: 1965-10-09, 55 y.o.   MRN: [id number] Virtual Visit via Telephone Note  I connected with Darren Larsen on 01/25/21 at 11:00 AM EDT by telephone and verified that I am speaking with the correct person using two identifiers.   Consent:  I discussed the limitations, risks, security and privacy concerns of performing an evaluation and management service by telephone and the availability of in person appointments. I also discussed with the patient that there may be a patient responsible charge related to this service. The patient expressed understanding and agreed to proceed.  Location of patient:Pt was at home  Location of provider:I was in my office  Persons participating in the televisit with the patient.   No one else on the call     History of Present Illness:   55 y.o.M here to re-est care Darren Larsen Patient  11/24/2020 This is a Darren Larsen  patient establishing for primary care last seen July 2021 as documented below Last seen in clinic 01/2020 with telehealth visit Darren Larsen: Essential hypertension Stable-continue - amLODipine (NORVASC) 10 MG tablet; Take 1 tablet (10 mg total) by mouth daily. To lower blood pressure  Dispense: 30 tablet; Refill: 2   2. Bilateral hand numbness - gabapentin (NEURONTIN) 300 MG capsule; Take 2 capsules (600 mg total) by mouth 3 (three) times daily. For nerve pain  Dispense: 180 capsule; Refill: 5   3. Back pain with radiation in L leg -increase dose of gabapentin to 600mg  tid - gabapentin (NEURONTIN) 300 MG capsule; Take 2 capsules (600 mg total) by mouth 3 (three) times daily. For nerve pain  Dispense: 180 capsule; Refill: 5 - naproxen (NAPROSYN) 500 MG tablet; Take 1 tablet (500 mg total) by mouth 2 (two) times daily with a meal. As needed for pain  Dispense: 60 tablet; Refill: 5   Patient states he has been compliant with amlodipine and chlorthalidone.  On arrival blood pressure 131/82.   Known history of hypertension and also chronic low back pain.  Also the patient had a spermatocele of the testicle which was operated on in 2019.  Patient states he still has chronic low back pain and left hip pain with radiation down to the left knee.  He has difficulty ambulating and is now using a cane.  He states the gabapentin and Naprosyn helped but he is ran out of these medications.  Patient is wishing to establish care for care does have full Medicaid.  Note the patient declined to receive COVID vaccination understands the risk.  Patient does need colonoscopy.  He had a Tdap 7 years ago.  No other real complaints at this time.  Patient is due for complete repeat lab screen  7/18  Darren Larsen was called today for a virtual visit to follow up on his HTN, GERD and sciatic back pain. He has an upcoming appointment with orthopedics on the 26th of July. He continues to experience left anterior and lateral thigh pain that radiates to the posterior lower leg. He denies foot pain but ambulates with a cane due to pain. He experiences the most pain getting up from a seating position and during initiation of movement but after he gets going, the pain subsides. Denies any falling or acute injury since last visit. He continues to take gabapentin 2 tab TID and naproxen for breakthrough pains. Denies numbness or tingling to affected extremity. He requests a refill for methocarbamol.   Patient continues  to take amlodipine and chlorthalidone for his blood pressure. He does not have a cuff at home but checks about every 2 weeks at a pharmacy. He cannot recall his most recent blood pressure but states he gets blurry vision when elevated. Denies any vision changes since last visit. He has a home exercise regimen for his back that he often participates in such as arm weights and exercise bands but his pain limits him from aerobic exercise. He has noticed some mild edema in is legs that usually goes away with massage,  exercise and compression socks.  He reports a diminished appetite which is normal for him. He usually eats only dinner. He denies acute abdominal pains, as well as NVD. He continues to take omeprazole for reflux and has no concerns regarding heartburn.  He continues to smoke 3 cigarettes per day on average. He says that he has quit in the past going cold [place] and says he has the mental strength to abstain from tobacco. I gently encouraged cessation as it will likely improve his back pain as well.   At his last visit, he was not able to get his labs drawn. Plan to re-order at upcoming follow up in August.    Past Medical History:  Diagnosis Date   Anxiety    Arthritis    hands   History of concussion    per pt concussion without LOC 1980s -- no residuals   History of seizure    06-21-2018  per pt was told over 30 yrs ago had a seizure while sleeping   Hypertension    Retinitis pigmentosa congenital    Spermatocele    left   Spermatocele of epididymis, unspecified 04/17/2018   Wears glasses      Family History  Problem Relation Age of Onset   Hypertension Mother      Social History   Socioeconomic History   Marital status: Legally Separated    Spouse name: Not on file   Number of children: 5   Years of education: 12    Highest education level: Not on file  Occupational History   Occupation: Carpentry    Occupation: Ambulance person   Tobacco Use   Smoking status: Darren    Packs/day: 0.50    Years: 20.00    Pack years: 10.00    Types: Cigarettes    Quit date: 06/14/2018    Years since quitting: 2.6   Smokeless tobacco: Never   Tobacco comments:    recently stopped smoking approx. 06-14-2018  Vaping Use   Vaping Use: Never used  Substance and Sexual Activity   Alcohol use: Yes    Comment: per pt average 32 oz beer per day   Drug use: Yes    Types: Marijuana    Comment: 06-21-2018 last marijuana per pt 3 wks ago   Sexual activity: Not on file  Other Topics Concern    Not on file  Social History Narrative   Live with niece   Children grown and gone.   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    Social Determinants of Health   Financial Resource Strain: Not on file  Food Insecurity: Not on file  Transportation Needs: Not on file  Physical Activity: Not on file  Stress: Not on file  Social Connections: Not on file  Intimate Partner Violence: Not on file     Allergies  Allergen Reactions  Aspirin Other (See Comments)    NOSE BLEED     Outpatient Medications Prior to Visit  Medication Sig Dispense Refill   acetaminophen (TYLENOL) 500 MG tablet Take 1 tablet (500 mg total) by mouth every 6 (six) hours as needed. 30 tablet 0   amLODipine (NORVASC) 10 MG tablet Take 1 tablet (10 mg total) by mouth daily. to lower blood pressure 90 tablet 1   chlorthalidone (HYGROTON) 25 MG tablet Take 1 tablet (25 mg total) by mouth daily. 90 tablet 1   gabapentin (NEURONTIN) 300 MG capsule TAKE 2 CAPSULES (600 MG TOTAL) BY MOUTH 3 (THREE) TIMES DAILY. FOR NERVE PAIN 180 capsule 5   naproxen (NAPROSYN) 500 MG tablet TAKE 1 TABLET (500 MG TOTAL) BY MOUTH 2 (TWO) TIMES DAILY WITH A MEAL. AS NEEDED FOR PAIN 60 tablet 5   Omega-3 1000 MG CAPS Take 2 capsules (2,000 mg total) by mouth daily. 60 capsule 2   omeprazole (PRILOSEC) 20 MG capsule TAKE 1 CAPSULE (20 MG TOTAL) BY MOUTH DAILY. FOR STOMACH PROTECTION 30 capsule 3   thiamine (VITAMIN B-1) 100 MG tablet Take 1 tablet (100 mg total) by mouth daily. 30 tablet 6   No facility-administered medications prior to visit.      Review of Systems  Constitutional:  Negative for fatigue and fever.  HENT: Negative.    Eyes:  Negative for pain and visual disturbance.  Respiratory: Negative.  Negative for cough, choking, chest tightness and shortness of breath.   Cardiovascular: Negative.  Negative for chest pain, palpitations and leg swelling.   Gastrointestinal: Negative.  Negative for abdominal pain, constipation, diarrhea, nausea and vomiting.  Genitourinary: Negative.  Negative for difficulty urinating.  Musculoskeletal:  Positive for back pain and gait problem. Negative for joint swelling.  Skin: Negative.  Negative for rash and wound.  Neurological:  Negative for dizziness, syncope, weakness, numbness and headaches.  Psychiatric/Behavioral: Negative.        Objective:   Physical Exam  There were no vitals filed for this visit.  No exam, Phone note     Assessment & Plan:  I personally reviewed all images and lab data in the [facility] system as well as any outside material available during this office visit and agree with the  radiology impressions.   HTN (hypertension) Unchanged and stable. Continues to take amlodipine and chlorthalidone but complicated by sciatic back pain, insomnia and tobacco use. No change to medicines today. Plan to follow up in August to draw labs that were missed at previous visit. Encouraged tobacco cessation and continued home exercises. Hopeful that a resolution of hip pain will positively impact blood pressure.   Chronic bilateral low back pain with left-sided sciatica Has upcoming apt with ortho later this month. Plan to continue Gabapentin 2 capsules TID and prn naproxen for breakthrough pain. Also called in a refill of methocarbamol. 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 Continue home exercises and therapies. Denies falling or acute worsening of chronic pain.   Tobacco dependence    Current smoking consumption amount: 3 cig /day  Dicsussion on advise to quit smoking and smoking impacts: CV impacts  Patient's willingness to quit:  Willing to quit  Methods to quit smoking discussed:  Not addressed in phone visit  Medication management of smoking session drugs discussed: not felt necesary   Setting quit date  Not yet established  Follow-up arranged  In august   Time  spent counseling the patient:  54min  Diagnoses and all orders for  this visit:  Chronic right shoulder pain -     methocarbamol (ROBAXIN) 500 MG tablet; Take 1 tablet (500 mg total) by mouth every 6 (six) hours as needed for muscle spasms.  Muscle spasm of back -     methocarbamol (ROBAXIN) 500 MG tablet; Take 1 tablet (500 mg total) by mouth every 6 (six) hours as needed for muscle spasms.  Primary hypertension  Chronic bilateral low back pain with left-sided sciatica  Gastroesophageal reflux disease without esophagitis  Tobacco dependence   Follow Up Instructions: Pt knows a lab/face to face OV will occur in August, refills on methacarbamol sent   I discussed the assessment and treatment plan with the patient. The patient was provided an opportunity to ask questions and all were answered. The patient agreed with the plan and demonstrated an understanding of the instructions.   The patient was advised to call back or seek an in-person evaluation if the symptoms worsen or if the condition fails to improve as anticipated.  I provided 8m inutes of non-face-to-face time during this encounter  including  median intraservice time , review of notes, labs, imaging, medications  and explaining diagnosis and management to the patient .    Asencion Noble, MD

## 2021-01-25 ENCOUNTER — Encounter: Payer: Self-pay | Admitting: Critical Care Medicine

## 2021-01-25 ENCOUNTER — Ambulatory Visit: Payer: Medicaid Other | Attending: Critical Care Medicine | Admitting: Critical Care Medicine

## 2021-01-25 ENCOUNTER — Other Ambulatory Visit: Payer: Self-pay

## 2021-01-25 DIAGNOSIS — I10 Essential (primary) hypertension: Secondary | ICD-10-CM | POA: Diagnosis not present

## 2021-01-25 DIAGNOSIS — M62830 Muscle spasm of back: Secondary | ICD-10-CM | POA: Diagnosis not present

## 2021-01-25 DIAGNOSIS — F17200 Nicotine dependence, unspecified, uncomplicated: Secondary | ICD-10-CM

## 2021-01-25 DIAGNOSIS — G8929 Other chronic pain: Secondary | ICD-10-CM

## 2021-01-25 DIAGNOSIS — M25511 Pain in right shoulder: Secondary | ICD-10-CM

## 2021-01-25 DIAGNOSIS — K219 Gastro-esophageal reflux disease without esophagitis: Secondary | ICD-10-CM

## 2021-01-25 DIAGNOSIS — M5442 Lumbago with sciatica, left side: Secondary | ICD-10-CM

## 2021-01-25 MED ORDER — METHOCARBAMOL 500 MG PO TABS
500.0000 mg | ORAL_TABLET | Freq: Four times a day (QID) | ORAL | 2 refills | Status: AC | PRN
  Filled 2021-01-25 – 2021-02-25 (×3): qty 120, 30d supply, fill #0

## 2021-01-25 NOTE — Assessment & Plan Note (Signed)
Unchanged and stable. Continues to take amlodipine and chlorthalidone but complicated by sciatic back pain, insomnia and tobacco use. No change to medicines today. Plan to follow up in August to draw labs that were missed at previous visit. Encouraged tobacco cessation and continued home exercises. Hopeful that a resolution of hip pain will positively impact blood pressure.

## 2021-01-25 NOTE — Assessment & Plan Note (Signed)
  .   Current smoking consumption amount: 3 cig /day  . Dicsussion on advise to quit smoking and smoking impacts: CV impacts  . Patient's willingness to quit:  Willing to quit  . Methods to quit smoking discussed:  Not addressed in phone visit  . Medication management of smoking session drugs discussed: not felt necesary   . Setting quit date  Not yet established  . Follow-up arranged  In august   Time spent counseling the patient:  14min

## 2021-01-25 NOTE — Assessment & Plan Note (Signed)
Has upcoming apt with ortho later this month. Plan to continue Gabapentin 2 capsules TID and prn naproxen for breakthrough pain. Also called in a refill of methocarbamol. 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 Continue home exercises and therapies. Denies falling or acute worsening of chronic pain.

## 2021-02-01 ENCOUNTER — Encounter: Payer: Self-pay | Admitting: Gastroenterology

## 2021-02-01 ENCOUNTER — Other Ambulatory Visit: Payer: Self-pay

## 2021-02-02 ENCOUNTER — Ambulatory Visit (INDEPENDENT_AMBULATORY_CARE_PROVIDER_SITE_OTHER): Payer: Medicaid Other | Admitting: Orthopaedic Surgery

## 2021-02-02 ENCOUNTER — Other Ambulatory Visit: Payer: Self-pay

## 2021-02-02 VITALS — Ht 69.0 in | Wt 241.6 lb

## 2021-02-02 DIAGNOSIS — M1612 Unilateral primary osteoarthritis, left hip: Secondary | ICD-10-CM

## 2021-02-02 NOTE — Progress Notes (Signed)
Office Visit Note   Patient: Darren Larsen           Date of Birth: 06-18-1966           MRN: [id number] Visit Date: 02/02/2021              Requested by: Elsie Stain, MD [address],  [place] [address] PCP: Elsie Stain, MD   Assessment & Plan: Visit Diagnoses:  1. Primary osteoarthritis of left hip     Plan: xrays show complete destruction of left hip joint consistent with severe DJD.  With the severity of the hip DJD, it will not respond effectively to medications or injections and he is not interested in temporary treatments.  He has chronic severe left hip pain that preventing him from performing ADLs and he has fear of falling.  Walking with cane only mildly improves the pain.  Details of the surgery discussed with the patient including risks benefits recovery time.  We will obtain necessary medical clearances for surgery from Dr. Joya Gaskins.  Prealbumin drawn today.  Follow-Up Instructions: No follow-ups on file.   Orders:  No orders of the defined types were placed in this encounter.  No orders of the defined types were placed in this encounter.     Procedures: No procedures performed   Clinical Data: No additional findings.   Subjective: Chief Complaint  Patient presents with   Left Hip - Pain    Darren Larsen is a 55 year old gentleman referral from Dr. Asencion Noble for chronic left hip pain for 6+ years with severe groin pain.  He ambulates with a cane to help take the edge off of the pain.  Currently lives with his niece.  Currently not employed.  Denies any radicular symptoms currently.  Recently quit smoking.  In terms of treatment for the hip pain he has undergone home exercise program, oral medications for over 6 years and this has not provided any relief.   Review of Systems  Constitutional: Negative.   All other systems reviewed and are negative.   Objective: Vital Signs: Ht '5\' 9"'$  (1.753 m)   Wt 241 lb 9.6 oz (109.6 kg)   BMI 35.68  kg/m   Physical Exam Vitals and nursing note reviewed.  Constitutional:      Appearance: He is well-developed.  HENT:     Head: Normocephalic and atraumatic.  Eyes:     Pupils: Pupils are equal, round, and reactive to light.  Pulmonary:     Effort: Pulmonary effort is normal.  Abdominal:     Palpations: Abdomen is soft.  Musculoskeletal:        General: Normal range of motion.     Cervical back: Neck supple.  Skin:    General: Skin is warm.  Neurological:     Mental Status: He is alert and oriented to person, place, and time.  Psychiatric:        Behavior: Behavior normal.        Thought Content: Thought content normal.        Judgment: Judgment normal.    Ortho Exam Left hip shows no scars or previous surgery.  Skin is intact.  He has essentially no range of motion secondary to severe disease and pain.  No sciatic tension signs.  Lateral hip is nontender.  Antalgic gait. Specialty Comments:  No specialty comments available.  Imaging: No results found.   PMFS History: Patient Active Problem List   Diagnosis Date Noted   Gastroesophageal  reflux 01/25/2021   Tobacco dependence 01/25/2021   Left hip pain 11/24/2020   Chronic bilateral low back pain with left-sided sciatica 11/24/2020   Bilateral hand numbness 10/29/2014   Onychomycosis of toenail 10/29/2014   HTN (hypertension) 10/29/2014   Retinitis pigmentosa    Past Medical History:  Diagnosis Date   Anxiety    Arthritis    hands   History of concussion    per pt concussion without LOC 1980s -- no residuals   History of seizure    06-21-2018  per pt was told over 30 yrs ago had a seizure while sleeping   Hypertension    Retinitis pigmentosa congenital    Spermatocele    left   Spermatocele of epididymis, unspecified 04/17/2018   Wears glasses     Family History  Problem Relation Age of Onset   Hypertension Mother     Past Surgical History:  Procedure Laterality Date   INGUINAL HERNIA REPAIR Left  infant;  age 45;  37s   SPERMATOCELECTOMY Left 06/26/2018   Procedure: SPERMATOCELECTOMY;  Surgeon: Ceasar Mons, MD;  Location: [facility];  Service: Urology;  Laterality: Left;   Social History   Occupational History   Occupation: Carpentry    Occupation: Ambulance person   Tobacco Use   Smoking status: Former    Packs/day: 0.50    Years: 20.00    Pack years: 10.00    Types: Cigarettes    Quit date: 06/14/2018    Years since quitting: 2.6   Smokeless tobacco: Never   Tobacco comments:    recently stopped smoking approx. 06-14-2018  Vaping Use   Vaping Use: Never used  Substance and Sexual Activity   Alcohol use: Yes    Comment: per pt average 32 oz beer per day   Drug use: Yes    Types: Marijuana    Comment: 06-21-2018 last marijuana per pt 3 wks ago   Sexual activity: Not on file

## 2021-02-03 LAB — PREALBUMIN: Prealbumin: 29 mg/dL (ref 21–43)

## 2021-02-03 LAB — EXTRA LAV TOP TUBE

## 2021-02-09 ENCOUNTER — Other Ambulatory Visit: Payer: Self-pay

## 2021-02-11 ENCOUNTER — Other Ambulatory Visit: Payer: Self-pay

## 2021-02-11 ENCOUNTER — Telehealth: Payer: Self-pay | Admitting: *Deleted

## 2021-02-11 ENCOUNTER — Ambulatory Visit (AMBULATORY_SURGERY_CENTER): Payer: Medicaid Other | Admitting: *Deleted

## 2021-02-11 VITALS — Ht 69.0 in | Wt 241.0 lb

## 2021-02-11 DIAGNOSIS — Z1211 Encounter for screening for malignant neoplasm of colon: Secondary | ICD-10-CM

## 2021-02-11 MED ORDER — NA SULFATE-K SULFATE-MG SULF 17.5-3.13-1.6 GM/177ML PO SOLN
1.0000 | Freq: Once | ORAL | 0 refills | Status: AC
  Filled 2021-02-11 – 2021-02-26 (×2): qty 354, 1d supply, fill #0

## 2021-02-11 NOTE — Progress Notes (Signed)
Pt's previsit is done over the phone and all paperwork (prep instructions, blank consent form to just read over) sent to patient.  Pt's name and DOB verified at the beginning of the previsit.  Pt denies any difficulty with ambulating.    No trouble with anesthesia, denies being told they were difficult to intubate, or hx/fam hx of malignant hyperthermia per pt   No egg or soy allergy  No home oxygen use   No medications for weight loss taken  emmi information given  Pt denies constipation issues  Pt informed that we do not do prior authorizations for prep

## 2021-02-11 NOTE — Telephone Encounter (Signed)
1630- Attempted to reach pt for PV; LMOM  1640- attempted to reach pt again.  LMOM to call back by 5:00 pm in order for colonoscopy with Dr. Silverio Decamp not to be cancelled  Pt called back- PV done 1646

## 2021-02-12 ENCOUNTER — Other Ambulatory Visit: Payer: Self-pay

## 2021-02-19 ENCOUNTER — Other Ambulatory Visit: Payer: Self-pay

## 2021-02-25 ENCOUNTER — Other Ambulatory Visit: Payer: Self-pay

## 2021-02-26 ENCOUNTER — Other Ambulatory Visit: Payer: Self-pay

## 2021-03-01 ENCOUNTER — Other Ambulatory Visit: Payer: Self-pay

## 2021-03-01 ENCOUNTER — Encounter: Payer: Self-pay | Admitting: Gastroenterology

## 2021-03-01 ENCOUNTER — Ambulatory Visit (AMBULATORY_SURGERY_CENTER): Payer: Medicaid Other | Admitting: Gastroenterology

## 2021-03-01 VITALS — BP 111/74 | HR 79 | Temp 98.0°F | Resp 18 | Ht 69.0 in | Wt 241.0 lb

## 2021-03-01 DIAGNOSIS — D128 Benign neoplasm of rectum: Secondary | ICD-10-CM

## 2021-03-01 DIAGNOSIS — Z1211 Encounter for screening for malignant neoplasm of colon: Secondary | ICD-10-CM

## 2021-03-01 MED ORDER — SODIUM CHLORIDE 0.9 % IV SOLN: 500.0000 mL | Freq: Once | INTRAVENOUS | Status: DC

## 2021-03-01 NOTE — Patient Instructions (Addendum)
Resume previous diet and continue present medications. Awaiting pathology results. Repeat Colonoscopy in 3-5 years for surveillance based on pathology results.  YOU HAD AN ENDOSCOPIC PROCEDURE TODAY AT [facility]:   Refer to the procedure report that was given to you for any specific questions about what was found during the examination.  If the procedure report does not answer your questions, please call your gastroenterologist to clarify.  If you requested that your care partner not be given the details of your procedure findings, then the procedure report has been included in a sealed envelope for you to review at your convenience later.  YOU SHOULD EXPECT: Some feelings of bloating in the abdomen. Passage of more gas than usual.  Walking can help get rid of the air that was put into your GI tract during the procedure and reduce the bloating. If you had a lower endoscopy (such as a colonoscopy or flexible sigmoidoscopy) you may notice spotting of blood in your stool or on the toilet paper. If you underwent a bowel prep for your procedure, you may not have a normal bowel movement for a few days.  Please Note:  You might notice some irritation and congestion in your nose or some drainage.  This is from the oxygen used during your procedure.  There is no need for concern and it should clear up in a day or so.  SYMPTOMS TO REPORT IMMEDIATELY:  Following lower endoscopy (colonoscopy or flexible sigmoidoscopy):  Excessive amounts of blood in the stool  Significant tenderness or worsening of abdominal pains  Swelling of the abdomen that is new, acute  Fever of 100F or higher  For urgent or emergent issues, a gastroenterologist can be reached at any hour by calling [phone]. Do not use MyChart messaging for urgent concerns.    DIET:  We do recommend a small meal at first, but then you may proceed to your regular diet.  Drink plenty of fluids but you should avoid alcoholic  beverages for 24 hours.  ACTIVITY:  You should plan to take it easy for the rest of today and you should NOT DRIVE or use heavy machinery until tomorrow (because of the sedation medicines used during the test).    FOLLOW UP: Our staff will call the number listed on your records 48-72 hours following your procedure to check on you and address any questions or concerns that you may have regarding the information given to you following your procedure. If we do not reach you, we will leave a message.  We will attempt to reach you two times.  During this call, we will ask if you have developed any symptoms of COVID 19. If you develop any symptoms (ie: fever, flu-like symptoms, shortness of breath, cough etc.) before then, please call [phone].  If you test positive for Covid 19 in the 2 weeks post procedure, please call and report this information to [place].    If any biopsies were taken you will be contacted by phone or by letter within the next 1-3 weeks.  Please call us at [phone] if you have not heard about the biopsies in 3 weeks.    SIGNATURES/CONFIDENTIALITY: You and/or your care partner have signed paperwork which will be entered into your electronic medical record.  These signatures attest to the fact that that the information above on your After Visit Summary has been reviewed and is understood.  Full responsibility of the confidentiality of this discharge information lies with you and/or  your care-partner.

## 2021-03-01 NOTE — Progress Notes (Signed)
Report given to PACU, vss 

## 2021-03-01 NOTE — Progress Notes (Signed)
Pt's states no medical or surgical changes since previsit or office visit.   VS taken by AS

## 2021-03-01 NOTE — Op Note (Signed)
[facility] Patient Name: Darren Larsen Procedure Date: 03/01/2021 2:01 PM MRN: [id number] Endoscopist: Mauri Pole , MD Age: 55 Referring MD:  Date of Birth: 10/02/1965 Gender: Male Account #: [id number] Procedure:                Colonoscopy Indications:              Screening for colorectal malignant neoplasm Medicines:                Monitored Anesthesia Care Procedure:                Pre-Anesthesia Assessment:                           - Prior to the procedure, a History and Physical                            was performed, and patient medications and                            allergies were reviewed. The patient's tolerance of                            previous anesthesia was also reviewed. The risks                            and benefits of the procedure and the sedation                            options and risks were discussed with the patient.                            All questions were answered, and informed consent                            was obtained. Prior Anticoagulants: The patient has                            taken no previous anticoagulant or antiplatelet                            agents. ASA Grade Assessment: II - A patient with                            mild systemic disease. After reviewing the risks                            and benefits, the patient was deemed in                            satisfactory condition to undergo the procedure.                           After obtaining informed consent, the colonoscope  was passed under direct vision. Throughout the                            procedure, the patient's blood pressure, pulse, and                            oxygen saturations were monitored continuously. The                            Olympus PCF-H190DL [id number]) Colonoscope was                            introduced through the anus and advanced to the the                            cecum,  identified by appendiceal orifice and                            ileocecal valve. The colonoscopy was performed                            without difficulty. The patient tolerated the                            procedure well. The quality of the bowel                            preparation was adequate. The ileocecal valve,                            appendiceal orifice, and rectum were photographed. Scope In: 2:10:26 PM Scope Out: 2:26:26 PM Scope Withdrawal Time: 0 hours 10 minutes 19 seconds  Total Procedure Duration: 0 hours 16 minutes 0 seconds  Findings:                 The perianal and digital rectal examinations were                            normal.                           Scattered small and large-mouthed diverticula were                            found in the sigmoid colon, descending colon,                            transverse colon and ascending colon.                           Three sessile polyps were found in the rectum. The                            polyps were 1 to 2 mm in size. These polyps were  removed with a cold biopsy forceps. Resection and                            retrieval were complete.                           Non-bleeding external and internal hemorrhoids were                            found during retroflexion. The hemorrhoids were                            large. Complications:            No immediate complications. Estimated Blood Loss:     Estimated blood loss was minimal. Impression:               - Moderate diverticulosis in the sigmoid colon, in                            the descending colon, in the transverse colon and                            in the ascending colon.                           - Three 1 to 2 mm polyps in the rectum, removed                            with a cold biopsy forceps. Resected and retrieved.                           - Non-bleeding external and internal hemorrhoids. Recommendation:            - Patient has a contact number available for                            emergencies. The signs and symptoms of potential                            delayed complications were discussed with the                            patient. Return to normal activities tomorrow.                            Written discharge instructions were provided to the                            patient.                           - Resume previous diet.                           - Continue present medications.                           -  Await pathology results.                           - Repeat colonoscopy in 3 - 5 years for                            surveillance based on pathology results. Mauri Pole, MD 03/01/2021 2:32:51 PM This report has been signed electronically.

## 2021-03-01 NOTE — Progress Notes (Signed)
Called to room to assist during endoscopic procedure.  Patient ID and intended procedure confirmed with present staff. Received instructions for my participation in the procedure from the performing physician.  

## 2021-03-03 ENCOUNTER — Ambulatory Visit: Payer: Medicaid Other | Admitting: Critical Care Medicine

## 2021-03-03 ENCOUNTER — Telehealth: Payer: Self-pay

## 2021-03-03 ENCOUNTER — Telehealth: Payer: Self-pay | Admitting: *Deleted

## 2021-03-03 NOTE — Telephone Encounter (Signed)
First follow up call attempt.  LVM. 

## 2021-03-03 NOTE — Telephone Encounter (Signed)
Attempted f/u call. No answer, left VM. 

## 2021-03-20 ENCOUNTER — Encounter: Payer: Self-pay | Admitting: Gastroenterology

## 2021-04-07 ENCOUNTER — Telehealth: Payer: Self-pay | Admitting: Critical Care Medicine

## 2021-04-07 NOTE — Telephone Encounter (Signed)
Copied from [place] [id number]. Topic: Referral - Request for Referral >> Apr 05, 2021  1:24 PM Erick Blinks wrote: Has patient seen PCP for this complaint? Yes.   *If NO, is insurance requiring patient see PCP for this issue before PCP can refer them? Referral for which specialty: Rehab services  Preferred provider/office: Highest recommended  Reason for referral: Pt has hip surgery upcoming on October 17th. He says he will not be able to live where he is, he is interested in going to a rehab facility while he recovers from his hip surgery.

## 2021-04-07 NOTE — Telephone Encounter (Signed)
Copied from [place] [phone]. Topic: Appointment Scheduling - Scheduling Inquiry for Clinic >> Apr 05, 2021  1:22 PM Erick Blinks wrote: Reason for CRM: Pt is requesting to be scheduled prior to his surgery on October 17th. He wants to be worked in if possible  Best contact: [phone]   Called patient no answer. Left vm to call [phone] to schedule an appt and provider further information on the type of appt needed. Whether its surgery clearance or a follow up appt.

## 2021-04-08 NOTE — Telephone Encounter (Signed)
Patient states that his surgery maybe postponed d/t to his weight.   Advised to share request with Ortho Specialist to see if inpatient rehab is advisable post op.

## 2021-04-13 ENCOUNTER — Ambulatory Visit: Payer: Medicaid Other | Admitting: Orthopaedic Surgery

## 2021-04-13 ENCOUNTER — Encounter: Payer: Self-pay | Admitting: Orthopaedic Surgery

## 2021-04-13 ENCOUNTER — Other Ambulatory Visit: Payer: Self-pay

## 2021-04-13 ENCOUNTER — Ambulatory Visit (INDEPENDENT_AMBULATORY_CARE_PROVIDER_SITE_OTHER): Payer: Medicaid Other | Admitting: Orthopaedic Surgery

## 2021-04-13 VITALS — Ht 69.5 in | Wt 235.2 lb

## 2021-04-13 DIAGNOSIS — M1612 Unilateral primary osteoarthritis, left hip: Secondary | ICD-10-CM

## 2021-04-13 NOTE — Progress Notes (Addendum)
Office Visit Note   Patient: Darren Larsen           Date of Birth: [birth year]           MRN: [id number] Visit Date: 04/13/2021              Requested by: Elsie Stain, MD [address],  [place] [address] PCP: Elsie Stain, MD   Assessment & Plan: Visit Diagnoses:  1. Primary osteoarthritis of left hip     Plan: Impression is advanced degenerative joint disease left hip.  The patient has tried multiple oral medications as well as worked on exercises all without relief of symptoms.  The pain has become debilitating and the patient is in constant fear of falling.  At this point, we have discussed total hip arthroplasty for which she would like to proceed.  Risk, benefits and poss medications reviewed.  Rehab recovery time discussed.  All questions answered.  The patient meets the AMA guidelines for obesity and I have recommended weight loss.  Follow-Up Instructions: Return for post-op.   Orders:  No orders of the defined types were placed in this encounter.  No orders of the defined types were placed in this encounter.     Procedures: No procedures performed   Clinical Data: No additional findings.   Subjective: Chief Complaint  Patient presents with   Left Hip - Pain    HPI patient is a pleasant 55 year old gentleman who comes in today with chronic left hip pain.  History of advanced degenerative joint disease to the left hip.  He has been dealing with this for many years.  His pain is progressively worsened.  The pain is primarily to the groin and anterior thigh.  It is described as a constant tooth ache worse with ambulation as well as going from a seated to standing position.  He is having to ambulate with a cane due to the significant pain.  He is in constant fear of falling due to the unsteadiness from the pain.  He has been on medications to include gabapentin and naproxen without relief.  Review of Systems as detailed in HPI.  All others  reviewed and are negative.   Objective: Vital Signs: Ht 5' 9.5" (1.765 m)   Wt 235 lb 3.2 oz (106.7 kg)   BMI 34.24 kg/m   Physical Exam well-developed well-nourished gentleman in no acute distress.  Alert and oriented x3.  Ortho Exam left hip exam shows a markedly positive logroll and FADIR.  No focal weakness.  He is neurovascular intact distally.  Specialty Comments:  No specialty comments available.  Imaging: No new imaging   PMFS History: Patient Active Problem List   Diagnosis Date Noted   Primary osteoarthritis of left hip 04/14/2021   Gastroesophageal reflux 01/25/2021   Tobacco dependence 01/25/2021   Left hip pain 11/24/2020   Chronic bilateral low back pain with left-sided sciatica 11/24/2020   Bilateral hand numbness 10/29/2014   Onychomycosis of toenail 10/29/2014   HTN (hypertension) 10/29/2014   Retinitis pigmentosa    Past Medical History:  Diagnosis Date   Anxiety    Arthritis    hands   Asthma    Cataract    GERD (gastroesophageal reflux disease)    History of concussion    per pt concussion without LOC 1980s -- no residuals   History of seizure    none in 30 years ago- none since then   Hypertension    Retinitis pigmentosa  congenital    Seizures (HCC)    Spermatocele    left   Spermatocele of epididymis, unspecified 04/17/2018   Wears glasses     Family History  Problem Relation Age of Onset   Hypertension Mother    Colon cancer Neg Hx    Esophageal cancer Neg Hx    Rectal cancer Neg Hx    Stomach cancer Neg Hx     Past Surgical History:  Procedure Laterality Date   COLONOSCOPY     greater than 30 years   INGUINAL HERNIA REPAIR Left infant;  age 1;  32s   SPERMATOCELECTOMY Left 06/26/2018   Procedure: SPERMATOCELECTOMY;  Surgeon: Ceasar Mons, MD;  Location: [facility];  Service: Urology;  Laterality: Left;   Social History   Occupational History   Occupation: Carpentry    Occupation: Ambulance person   Tobacco Use    Smoking status: Former    Packs/day: 0.50    Years: 20.00    Pack years: 10.00    Types: Cigarettes    Quit date: 06/14/2018    Years since quitting: 2.8   Smokeless tobacco: Never   Tobacco comments:    recently stopped smoking approx. 06-14-2018  Vaping Use   Vaping Use: Never used  Substance and Sexual Activity   Alcohol use: Yes    Comment: per pt average 32 oz beer per day   Drug use: Yes    Types: Marijuana   Sexual activity: Not on file

## 2021-04-14 DIAGNOSIS — M1612 Unilateral primary osteoarthritis, left hip: Secondary | ICD-10-CM | POA: Insufficient documentation

## 2021-04-19 ENCOUNTER — Other Ambulatory Visit: Payer: Self-pay | Admitting: Physician Assistant

## 2021-04-19 ENCOUNTER — Telehealth: Payer: Self-pay | Admitting: Physician Assistant

## 2021-04-19 ENCOUNTER — Other Ambulatory Visit: Payer: Self-pay

## 2021-04-19 MED ORDER — RIVAROXABAN 10 MG PO TABS
10.0000 mg | ORAL_TABLET | Freq: Every day | ORAL | 0 refills | Status: DC
  Filled 2021-04-19: qty 30, 30d supply, fill #0

## 2021-04-19 MED ORDER — METHOCARBAMOL 500 MG PO TABS
500.0000 mg | ORAL_TABLET | Freq: Two times a day (BID) | ORAL | 2 refills | Status: DC | PRN
  Filled 2021-04-19: qty 20, 10d supply, fill #0

## 2021-04-19 MED ORDER — DOCUSATE SODIUM 100 MG PO CAPS
100.0000 mg | ORAL_CAPSULE | Freq: Every day | ORAL | 2 refills | Status: AC | PRN
  Filled 2021-04-19: qty 30, 30d supply, fill #0

## 2021-04-19 MED ORDER — ONDANSETRON HCL 4 MG PO TABS
4.0000 mg | ORAL_TABLET | Freq: Three times a day (TID) | ORAL | 0 refills | Status: DC | PRN
  Filled 2021-04-19: qty 40, 14d supply, fill #0

## 2021-04-19 MED ORDER — OXYCODONE-ACETAMINOPHEN 5-325 MG PO TABS: 1.0000 | ORAL_TABLET | Freq: Four times a day (QID) | ORAL | 0 refills | Status: DC | PRN

## 2021-04-19 NOTE — Telephone Encounter (Signed)
Rosann Auerbach (pharmacist) called from [facility] on [facility] called about pt's pain medication. Rosann Auerbach isd asking for a call back as soon as possible about dosage. Pharmacy phone number is [phone].

## 2021-04-20 ENCOUNTER — Other Ambulatory Visit: Payer: Self-pay | Admitting: Physician Assistant

## 2021-04-20 ENCOUNTER — Other Ambulatory Visit: Payer: Self-pay

## 2021-04-20 MED ORDER — OXYCODONE-ACETAMINOPHEN 5-325 MG PO TABS: 1.0000 | ORAL_TABLET | ORAL | 0 refills | Status: DC | PRN

## 2021-04-20 NOTE — Telephone Encounter (Signed)
Called pharm back. States Max 6 oxy tabs a day. New Rx need to be sent to pharm.

## 2021-04-20 NOTE — Telephone Encounter (Signed)
Sent in

## 2021-04-20 NOTE — Telephone Encounter (Signed)
Patient aware RX sent to new location closer to him.

## 2021-04-20 NOTE — Telephone Encounter (Signed)
Address: [address] [place], [place] [address]

## 2021-04-21 NOTE — Pre-Procedure Instructions (Signed)
Surgical Instructions   Your procedure is scheduled on Monday, October 17th. Report to [facility] Main Entrance "A" at 07:45 A.M., then check in with the Admitting office. Call this number if you have problems the morning of surgery: [phone]   If you have any questions prior to your surgery date call [phone]: Open Monday-Friday 8am-4pm   Remember: Do not eat after midnight the night before your surgery  You may drink clear liquids until 06:45 AM the morning of your surgery.   Clear liquids allowed are: Water, Non-Citrus Juices (without pulp), Carbonated Beverages, Clear Tea, Black Coffee Only, and Gatorade   Patient Instructions  The night before surgery:  No food after midnight. ONLY clear liquids after midnight  The day of surgery (if you do NOT have diabetes):  Drink ONE (1) Pre-Surgery Clear Ensure by 06:45 AM the morning of surgery. Drink in one sitting. Do not sip.  This drink was given to you during your hospital  pre-op appointment visit.  Nothing else to drink after completing the  Pre-Surgery Clear Ensure.          If you have questions, please contact your surgeon's office.     Take these medicines the morning of surgery with A SIP OF WATER  amLODipine (NORVASC) omeprazole (PRILOSEC)  If needed: fexofenadine (ALLEGRA) gabapentin (NEURONTIN) ondansetron (ZOFRAN) oxyCODONE-acetaminophen (PERCOCET)   As of today, STOP taking any Aspirin (unless otherwise instructed by your surgeon) Aleve, Naproxen, Ibuprofen, Motrin, Advil, Goody's, BC's, all herbal medications, diclofenac Sodium (VOLTAREN) gel, fish oil, and all vitamins.                     Do NOT Smoke (Tobacco/Vaping) or drink Alcohol 24 hours prior to your procedure.  If you use a CPAP at night, you may bring all equipment for your overnight stay.   Contacts, glasses, piercing's, hearing aid's, dentures or partials may not be worn into surgery, please bring cases for these belongings.    For  patients admitted to the hospital, discharge time will be determined by your treatment team.   Patients discharged the day of surgery will not be allowed to drive home, and someone needs to stay with them for 24 hours.  NO VISITORS WILL BE ALLOWED IN PRE-OP WHERE PATIENTS GET READY FOR SURGERY.  ONLY 1 SUPPORT PERSON MAY BE PRESENT IN THE WAITING ROOM WHILE YOU ARE IN SURGERY.  IF YOU ARE TO BE ADMITTED, ONCE YOU ARE IN YOUR ROOM YOU WILL BE ALLOWED TWO (2) VISITORS.  Minor children may have two parents present. Special consideration for safety and communication needs will be reviewed on a case by case basis.   Special instructions:   [place]- Preparing For Surgery  Before surgery, you can play an important role. Because skin is not sterile, your skin needs to be as free of germs as possible. You can reduce the number of germs on your skin by washing with CHG (chlorahexidine gluconate) Soap before surgery.  CHG is an antiseptic cleaner which kills germs and bonds with the skin to continue killing germs even after washing.    Oral Hygiene is also important to reduce your risk of infection.  Remember - BRUSH YOUR TEETH THE MORNING OF SURGERY WITH YOUR REGULAR TOOTHPASTE  Please do not use if you have an allergy to CHG or antibacterial soaps. If your skin becomes reddened/irritated stop using the CHG.  Do not shave (including legs and underarms) for at least 48 hours prior to  first CHG shower. It is OK to shave your face.  Please follow these instructions carefully.   Shower the NIGHT BEFORE SURGERY and the MORNING OF SURGERY  If you chose to wash your hair, wash your hair first as usual with your normal shampoo.  After you shampoo, rinse your hair and body thoroughly to remove the shampoo.  Use CHG Soap as you would any other liquid soap. You can apply CHG directly to the skin and wash gently with a scrungie or a clean washcloth.   Apply the CHG Soap to your body ONLY FROM THE NECK DOWN.   Do not use on open wounds or open sores. Avoid contact with your eyes, ears, mouth and genitals (private parts). Wash Face and genitals (private parts)  with your normal soap.   Wash thoroughly, paying special attention to the area where your surgery will be performed.  Thoroughly rinse your body with warm water from the neck down.  DO NOT shower/wash with your normal soap after using and rinsing off the CHG Soap.  Pat yourself dry with a CLEAN TOWEL.  Wear CLEAN PAJAMAS to bed the night before surgery  Place CLEAN SHEETS on your bed the night before your surgery  DO NOT SLEEP WITH PETS.   Day of Surgery: Shower with CHG soap. Do not wear jewelry Do not wear lotions, powders, colognes, or deodorant. Men may shave face and neck. Do not bring valuables to the hospital. [facility] is not responsible for any belongings or valuables. Wear Clean/Comfortable clothing the morning of surgery Remember to brush your teeth WITH YOUR REGULAR TOOTHPASTE.   Please read over the following fact sheets that you were given.   3 days prior to your procedure or After your COVID test   You are not required to quarantine however you are required to wear a well-fitting mask when you are out and around people not in your household. If your mask becomes wet or soiled, replace with a new one.   Wash your hands often with soap and water for 20 seconds or clean your hands with an alcohol-based hand sanitizer that contains at least 60% alcohol.   Do not share personal items.   Notify your provider:  o if you are in close contact with someone who has COVID  o or if you develop a fever of 100.4 or greater, sneezing, cough, sore throat, shortness of breath or body aches.

## 2021-04-22 ENCOUNTER — Other Ambulatory Visit: Payer: Self-pay

## 2021-04-22 ENCOUNTER — Encounter (HOSPITAL_COMMUNITY)
Admission: RE | Admit: 2021-04-22 | Discharge: 2021-04-22 | Disposition: A | Discharge status: Home / Self Care | Payer: Medicaid Other | Source: Ambulatory Visit | Attending: Orthopaedic Surgery | Admitting: Orthopaedic Surgery

## 2021-04-22 ENCOUNTER — Encounter (HOSPITAL_COMMUNITY): Payer: Self-pay

## 2021-04-22 DIAGNOSIS — Z01818 Encounter for other preprocedural examination: Secondary | ICD-10-CM | POA: Insufficient documentation

## 2021-04-22 DIAGNOSIS — Z20822 Contact with and (suspected) exposure to covid-19: Secondary | ICD-10-CM | POA: Insufficient documentation

## 2021-04-22 LAB — SURGICAL PCR SCREEN
MRSA, PCR: NEGATIVE
Staphylococcus aureus: NEGATIVE

## 2021-04-22 LAB — CBC WITH DIFFERENTIAL/PLATELET
Abs Immature Granulocytes: 0.03 10*3/uL (ref 0.00–0.07)
Basophils Absolute: 0 10*3/uL (ref 0.0–0.1)
Basophils Relative: 0 %
Eosinophils Absolute: 0.4 10*3/uL (ref 0.0–0.5)
Eosinophils Relative: 4 %
HCT: 40.8 % (ref 39.0–52.0)
Hemoglobin: 13.1 g/dL (ref 13.0–17.0)
Immature Granulocytes: 0 %
Lymphocytes Relative: 36 %
Lymphs Abs: 3.3 10*3/uL (ref 0.7–4.0)
MCH: 24 pg — ABNORMAL LOW (ref 26.0–34.0)
MCHC: 32.1 g/dL (ref 30.0–36.0)
MCV: 74.9 fL — ABNORMAL LOW (ref 80.0–100.0)
Monocytes Absolute: 1 10*3/uL (ref 0.1–1.0)
Monocytes Relative: 11 %
Neutro Abs: 4.4 10*3/uL (ref 1.7–7.7)
Neutrophils Relative %: 49 %
Platelets: 194 10*3/uL (ref 150–400)
RBC: 5.45 MIL/uL (ref 4.22–5.81)
RDW: 17.9 % — ABNORMAL HIGH (ref 11.5–15.5)
WBC: 9.2 10*3/uL (ref 4.0–10.5)
nRBC: 0.3 % — ABNORMAL HIGH (ref 0.0–0.2)

## 2021-04-22 LAB — COMPREHENSIVE METABOLIC PANEL
ALT: 24 U/L (ref 0–44)
AST: 21 U/L (ref 15–41)
Albumin: 4 g/dL (ref 3.5–5.0)
Alkaline Phosphatase: 91 U/L (ref 38–126)
Anion gap: 8 (ref 5–15)
BUN: 20 mg/dL (ref 6–20)
CO2: 26 mmol/L (ref 22–32)
Calcium: 9.5 mg/dL (ref 8.9–10.3)
Chloride: 105 mmol/L (ref 98–111)
Creatinine, Ser: 0.97 mg/dL (ref 0.61–1.24)
GFR, Estimated: >60 mL/min (ref >60)
Glucose, Bld: 128 mg/dL — ABNORMAL HIGH (ref 70–99)
Potassium: 4.1 mmol/L (ref 3.5–5.1)
Sodium: 139 mmol/L (ref 135–145)
Total Bilirubin: 0.6 mg/dL (ref 0.3–1.2)
Total Protein: 7.3 g/dL (ref 6.5–8.1)

## 2021-04-22 LAB — URINALYSIS, ROUTINE W REFLEX MICROSCOPIC
Bilirubin Urine: NEGATIVE
Glucose, UA: NEGATIVE mg/dL
Hgb urine dipstick: NEGATIVE
Ketones, ur: 5 mg/dL — AB
Leukocytes,Ua: NEGATIVE
Nitrite: NEGATIVE
Protein, ur: NEGATIVE mg/dL
Specific Gravity, Urine: 1.029 (ref 1.005–1.030)
pH: 5 (ref 5.0–8.0)

## 2021-04-22 LAB — PROTIME-INR
INR: 1 (ref 0.8–1.2)
Prothrombin Time: 13 seconds (ref 11.4–15.2)

## 2021-04-22 LAB — APTT: aPTT: 26 seconds (ref 24–36)

## 2021-04-22 LAB — SARS CORONAVIRUS 2 (TAT 6-24 HRS): SARS Coronavirus 2: NEGATIVE

## 2021-04-22 NOTE — Progress Notes (Signed)
PCP - Dr. Asencion Noble Cardiologist - denies  PPM/ICD - denies   Chest x-ray - 12/02/10 EKG - 04/22/21 at PAT appt Stress Test - denies ECHO - denies Cardiac Cath - denies  Sleep Study - denies   DM- denies  Blood Thinner Instructions: n/a Aspirin Instructions: n/a  ERAS Protcol - yes PRE-SURGERY Ensure   COVID TEST- 04/22/21 at PAT appt, results pending   Anesthesia review: No  Patient denies shortness of breath, fever, cough and chest pain at PAT appointment   All instructions explained to the patient, with a verbal understanding of the material. Patient agrees to go over the instructions while at home for a better understanding. Patient also instructed to wear a mask in public after being tested for COVID-19. The opportunity to ask questions was provided.

## 2021-04-23 MED ORDER — TRANEXAMIC ACID 1000 MG/10ML IV SOLN
2000.0000 mg | INTRAVENOUS | Status: DC
  Filled 2021-04-23: qty 20

## 2021-04-26 ENCOUNTER — Ambulatory Visit (HOSPITAL_COMMUNITY): Admission: RE | Admit: 2021-04-26 | Payer: Medicaid Other | Source: Home / Self Care | Admitting: Orthopaedic Surgery

## 2021-04-26 ENCOUNTER — Encounter (HOSPITAL_COMMUNITY): Admission: RE | Payer: Self-pay | Source: Home / Self Care

## 2021-04-26 SURGERY — ARTHROPLASTY, HIP, TOTAL, ANTERIOR APPROACH
Anesthesia: Spinal | Site: Hip | Laterality: Left

## 2021-04-28 ENCOUNTER — Other Ambulatory Visit: Payer: Self-pay

## 2021-04-30 NOTE — Progress Notes (Signed)
I spoke with Darren Larsen, patient will arrive at 36, he needs a Covid test. I instructed patient htat he needs to stop drinking by 1155, last beverage should be Pre-  Surgery Ensure.  I instructed patient to follow the instruction sheet he was given when Pre- Surgery Testing was done.

## 2021-05-03 ENCOUNTER — Observation Stay (HOSPITAL_COMMUNITY)
Admission: RE | Admit: 2021-05-03 | Discharge: 2021-05-04 | Disposition: A | Discharge status: Home / Self Care | Payer: Medicaid Other | Attending: Orthopaedic Surgery | Admitting: Orthopaedic Surgery

## 2021-05-03 ENCOUNTER — Ambulatory Visit (HOSPITAL_COMMUNITY): Payer: Medicaid Other | Admitting: Anesthesiology

## 2021-05-03 ENCOUNTER — Encounter (HOSPITAL_COMMUNITY): Payer: Self-pay | Admitting: Orthopaedic Surgery

## 2021-05-03 ENCOUNTER — Ambulatory Visit (HOSPITAL_COMMUNITY): Payer: Medicaid Other

## 2021-05-03 ENCOUNTER — Observation Stay (HOSPITAL_COMMUNITY): Payer: Medicaid Other

## 2021-05-03 ENCOUNTER — Other Ambulatory Visit: Payer: Self-pay

## 2021-05-03 ENCOUNTER — Encounter (HOSPITAL_COMMUNITY)
Admission: RE | Disposition: A | Discharge status: Home / Self Care | Payer: Self-pay | Source: Home / Self Care | Attending: Orthopaedic Surgery

## 2021-05-03 DIAGNOSIS — Z96649 Presence of unspecified artificial hip joint: Secondary | ICD-10-CM

## 2021-05-03 DIAGNOSIS — Z419 Encounter for procedure for purposes other than remedying health state, unspecified: Secondary | ICD-10-CM

## 2021-05-03 DIAGNOSIS — I10 Essential (primary) hypertension: Secondary | ICD-10-CM | POA: Diagnosis not present

## 2021-05-03 DIAGNOSIS — M1612 Unilateral primary osteoarthritis, left hip: Secondary | ICD-10-CM

## 2021-05-03 DIAGNOSIS — F17210 Nicotine dependence, cigarettes, uncomplicated: Secondary | ICD-10-CM | POA: Insufficient documentation

## 2021-05-03 DIAGNOSIS — Z96642 Presence of left artificial hip joint: Secondary | ICD-10-CM

## 2021-05-03 DIAGNOSIS — Z20822 Contact with and (suspected) exposure to covid-19: Secondary | ICD-10-CM | POA: Diagnosis not present

## 2021-05-03 DIAGNOSIS — J45909 Unspecified asthma, uncomplicated: Secondary | ICD-10-CM | POA: Insufficient documentation

## 2021-05-03 DIAGNOSIS — Z79899 Other long term (current) drug therapy: Secondary | ICD-10-CM | POA: Diagnosis not present

## 2021-05-03 DIAGNOSIS — Z7901 Long term (current) use of anticoagulants: Secondary | ICD-10-CM | POA: Insufficient documentation

## 2021-05-03 DIAGNOSIS — Z978 Presence of other specified devices: Secondary | ICD-10-CM

## 2021-05-03 HISTORY — PX: TOTAL HIP ARTHROPLASTY: SHX124

## 2021-05-03 LAB — TYPE AND SCREEN
ABO/RH(D): O POS
Antibody Screen: NEGATIVE

## 2021-05-03 LAB — ABO/RH: ABO/RH(D): O POS

## 2021-05-03 LAB — SARS CORONAVIRUS 2 BY RT PCR (HOSPITAL ORDER, PERFORMED IN ~~LOC~~ HOSPITAL LAB): SARS Coronavirus 2: NEGATIVE

## 2021-05-03 SURGERY — ARTHROPLASTY, HIP, TOTAL, ANTERIOR APPROACH
Anesthesia: Spinal | Site: Hip | Laterality: Left

## 2021-05-03 MED ORDER — CEFAZOLIN SODIUM-DEXTROSE 2-4 GM/100ML-% IV SOLN
2.0000 g | INTRAVENOUS | Status: AC
  Administered 2021-05-03: 2 g via INTRAVENOUS
  Filled 2021-05-03: qty 100

## 2021-05-03 MED ORDER — DEXAMETHASONE SODIUM PHOSPHATE 10 MG/ML IJ SOLN
10.0000 mg | Freq: Once | INTRAMUSCULAR | Status: AC
  Administered 2021-05-04: 10 mg via INTRAVENOUS
  Filled 2021-05-03: qty 1

## 2021-05-03 MED ORDER — SUGAMMADEX SODIUM 200 MG/2ML IV SOLN
INTRAVENOUS | Status: DC | PRN
  Administered 2021-05-03: 200 mg via INTRAVENOUS

## 2021-05-03 MED ORDER — LIDOCAINE HCL (CARDIAC) PF 100 MG/5ML IV SOSY
PREFILLED_SYRINGE | INTRAVENOUS | Status: DC | PRN
  Administered 2021-05-03: 100 mg via INTRATRACHEAL

## 2021-05-03 MED ORDER — ALUM & MAG HYDROXIDE-SIMETH 200-200-20 MG/5ML PO SUSP: 30.0000 mL | ORAL | Status: DC | PRN

## 2021-05-03 MED ORDER — ACETAMINOPHEN 500 MG PO TABS
1000.0000 mg | ORAL_TABLET | Freq: Four times a day (QID) | ORAL | Status: AC
  Administered 2021-05-03 – 2021-05-04 (×4): 1000 mg via ORAL
  Filled 2021-05-03 (×4): qty 2

## 2021-05-03 MED ORDER — POVIDONE-IODINE 10 % EX SWAB: 2.0000 "application " | Freq: Once | CUTANEOUS | Status: DC

## 2021-05-03 MED ORDER — ACETAMINOPHEN 325 MG PO TABS: 325.0000 mg | ORAL_TABLET | Freq: Four times a day (QID) | ORAL | Status: DC | PRN

## 2021-05-03 MED ORDER — METOCLOPRAMIDE HCL 5 MG/ML IJ SOLN: 5.0000 mg | Freq: Three times a day (TID) | INTRAMUSCULAR | Status: DC | PRN

## 2021-05-03 MED ORDER — FENTANYL CITRATE (PF) 100 MCG/2ML IJ SOLN
INTRAMUSCULAR | Status: AC
  Filled 2021-05-03: qty 2

## 2021-05-03 MED ORDER — METHOCARBAMOL 1000 MG/10ML IJ SOLN
500.0000 mg | Freq: Four times a day (QID) | INTRAVENOUS | Status: DC | PRN
  Filled 2021-05-03: qty 5

## 2021-05-03 MED ORDER — CEFAZOLIN SODIUM-DEXTROSE 2-4 GM/100ML-% IV SOLN
2.0000 g | Freq: Four times a day (QID) | INTRAVENOUS | Status: AC
  Administered 2021-05-03 – 2021-05-04 (×2): 2 g via INTRAVENOUS
  Filled 2021-05-03 (×2): qty 100

## 2021-05-03 MED ORDER — ONDANSETRON HCL 4 MG/2ML IJ SOLN
INTRAMUSCULAR | Status: AC
  Filled 2021-05-03: qty 2

## 2021-05-03 MED ORDER — MIDAZOLAM HCL 2 MG/2ML IJ SOLN
INTRAMUSCULAR | Status: AC
  Filled 2021-05-03: qty 2

## 2021-05-03 MED ORDER — VANCOMYCIN HCL 1000 MG IV SOLR
INTRAVENOUS | Status: AC
  Filled 2021-05-03: qty 20

## 2021-05-03 MED ORDER — DEXMEDETOMIDINE (PRECEDEX) IN NS 20 MCG/5ML (4 MCG/ML) IV SYRINGE
PREFILLED_SYRINGE | INTRAVENOUS | Status: DC | PRN
  Administered 2021-05-03: 12 ug via INTRAVENOUS
  Administered 2021-05-03: 8 ug via INTRAVENOUS

## 2021-05-03 MED ORDER — TRANEXAMIC ACID-NACL 1000-0.7 MG/100ML-% IV SOLN
1000.0000 mg | Freq: Once | INTRAVENOUS | Status: AC
  Administered 2021-05-03: 1000 mg via INTRAVENOUS
  Filled 2021-05-03: qty 100

## 2021-05-03 MED ORDER — CHLORHEXIDINE GLUCONATE 0.12 % MT SOLN
15.0000 mL | Freq: Once | OROMUCOSAL | Status: AC
  Administered 2021-05-03: 15 mL via OROMUCOSAL
  Filled 2021-05-03: qty 15

## 2021-05-03 MED ORDER — VANCOMYCIN HCL 1 G IV SOLR
INTRAVENOUS | Status: DC | PRN
  Administered 2021-05-03: 1000 mg

## 2021-05-03 MED ORDER — ONDANSETRON HCL 4 MG/2ML IJ SOLN
INTRAMUSCULAR | Status: DC | PRN
  Administered 2021-05-03: 4 mg via INTRAVENOUS

## 2021-05-03 MED ORDER — OXYCODONE HCL 5 MG/5ML PO SOLN: 5.0000 mg | Freq: Once | ORAL | Status: DC | PRN

## 2021-05-03 MED ORDER — LACTATED RINGERS IV SOLN: INTRAVENOUS | Status: DC

## 2021-05-03 MED ORDER — MENTHOL 3 MG MT LOZG: 1.0000 | LOZENGE | OROMUCOSAL | Status: DC | PRN

## 2021-05-03 MED ORDER — OXYCODONE HCL 5 MG PO TABS
5.0000 mg | ORAL_TABLET | ORAL | Status: DC | PRN
  Administered 2021-05-03 – 2021-05-04 (×2): 10 mg via ORAL
  Filled 2021-05-03 (×2): qty 2

## 2021-05-03 MED ORDER — BUPIVACAINE-MELOXICAM ER 200-6 MG/7ML IJ SOLN
INTRAMUSCULAR | Status: AC
  Filled 2021-05-03: qty 1

## 2021-05-03 MED ORDER — PANTOPRAZOLE SODIUM 40 MG PO TBEC
40.0000 mg | DELAYED_RELEASE_TABLET | Freq: Every day | ORAL | Status: DC
  Administered 2021-05-03 – 2021-05-04 (×2): 40 mg via ORAL
  Filled 2021-05-03: qty 1

## 2021-05-03 MED ORDER — PROMETHAZINE HCL 25 MG/ML IJ SOLN
6.2500 mg | INTRAMUSCULAR | Status: DC | PRN
Start: 2021-05-03 — End: 2021-05-03

## 2021-05-03 MED ORDER — FENTANYL CITRATE (PF) 250 MCG/5ML IJ SOLN
INTRAMUSCULAR | Status: AC
  Filled 2021-05-03: qty 5

## 2021-05-03 MED ORDER — ORAL CARE MOUTH RINSE: 15.0000 mL | Freq: Once | OROMUCOSAL | Status: AC

## 2021-05-03 MED ORDER — OXYCODONE HCL 5 MG PO TABS
5.0000 mg | ORAL_TABLET | Freq: Once | ORAL | Status: DC | PRN
Start: 2021-05-03 — End: 2021-05-03

## 2021-05-03 MED ORDER — HYDROMORPHONE HCL 1 MG/ML IJ SOLN
0.5000 mg | INTRAMUSCULAR | Status: DC | PRN
  Administered 2021-05-03: 1 mg via INTRAVENOUS
  Filled 2021-05-03: qty 1

## 2021-05-03 MED ORDER — OXYCODONE HCL 5 MG PO TABS
10.0000 mg | ORAL_TABLET | ORAL | Status: DC | PRN
Start: 2021-05-03 — End: 2021-05-04
  Administered 2021-05-04 (×2): 15 mg via ORAL
  Filled 2021-05-03 (×2): qty 3

## 2021-05-03 MED ORDER — AMLODIPINE BESYLATE 5 MG PO TABS
10.0000 mg | ORAL_TABLET | Freq: Every day | ORAL | Status: DC
Start: 2021-05-03 — End: 2021-05-04
  Administered 2021-05-04: 10 mg via ORAL
  Filled 2021-05-03: qty 2

## 2021-05-03 MED ORDER — METHOCARBAMOL 500 MG PO TABS
500.0000 mg | ORAL_TABLET | Freq: Four times a day (QID) | ORAL | Status: DC | PRN
  Administered 2021-05-03 – 2021-05-04 (×3): 500 mg via ORAL
  Filled 2021-05-03 (×3): qty 1

## 2021-05-03 MED ORDER — ROCURONIUM BROMIDE 100 MG/10ML IV SOLN
INTRAVENOUS | Status: DC | PRN
  Administered 2021-05-03: 60 mg via INTRAVENOUS

## 2021-05-03 MED ORDER — SODIUM CHLORIDE 0.9 % IR SOLN
Status: DC | PRN
  Administered 2021-05-03: 1000 mL

## 2021-05-03 MED ORDER — IRRISEPT - 450ML BOTTLE WITH 0.05% CHG IN STERILE WATER, USP 99.95% OPTIME
TOPICAL | Status: DC | PRN
  Administered 2021-05-03: 450 mL via TOPICAL

## 2021-05-03 MED ORDER — TRANEXAMIC ACID-NACL 1000-0.7 MG/100ML-% IV SOLN
1000.0000 mg | INTRAVENOUS | Status: AC
  Administered 2021-05-03: 1000 mg via INTRAVENOUS
  Filled 2021-05-03: qty 100

## 2021-05-03 MED ORDER — POLYETHYLENE GLYCOL 3350 17 G PO PACK
17.0000 g | PACK | Freq: Every day | ORAL | Status: DC
  Administered 2021-05-04: 17 g via ORAL
  Filled 2021-05-03: qty 1

## 2021-05-03 MED ORDER — FENTANYL CITRATE (PF) 100 MCG/2ML IJ SOLN
25.0000 ug | INTRAMUSCULAR | Status: DC | PRN
  Administered 2021-05-03 (×3): 50 ug via INTRAVENOUS

## 2021-05-03 MED ORDER — PROPOFOL 10 MG/ML IV BOLUS
INTRAVENOUS | Status: DC | PRN
  Administered 2021-05-03: 200 mg via INTRAVENOUS

## 2021-05-03 MED ORDER — SORBITOL 70 % SOLN: 30.0000 mL | Freq: Every day | Status: DC | PRN

## 2021-05-03 MED ORDER — DOCUSATE SODIUM 100 MG PO CAPS
100.0000 mg | ORAL_CAPSULE | Freq: Two times a day (BID) | ORAL | Status: DC
  Administered 2021-05-03 – 2021-05-04 (×2): 100 mg via ORAL
  Filled 2021-05-03 (×2): qty 1

## 2021-05-03 MED ORDER — FENTANYL CITRATE (PF) 100 MCG/2ML IJ SOLN
INTRAMUSCULAR | Status: DC | PRN
  Administered 2021-05-03 (×5): 50 ug via INTRAVENOUS

## 2021-05-03 MED ORDER — TRANEXAMIC ACID 1000 MG/10ML IV SOLN
2000.0000 mg | Freq: Once | INTRAVENOUS | Status: DC
  Filled 2021-05-03: qty 20

## 2021-05-03 MED ORDER — TRANEXAMIC ACID 1000 MG/10ML IV SOLN
INTRAVENOUS | Status: DC | PRN
  Administered 2021-05-03: 2000 mg via TOPICAL

## 2021-05-03 MED ORDER — DEXAMETHASONE SODIUM PHOSPHATE 10 MG/ML IJ SOLN
INTRAMUSCULAR | Status: DC | PRN
  Administered 2021-05-03: 10 mg via INTRAVENOUS

## 2021-05-03 MED ORDER — SODIUM CHLORIDE 0.9 % IV SOLN: INTRAVENOUS | Status: DC

## 2021-05-03 MED ORDER — PHENOL 1.4 % MT LIQD: 1.0000 | OROMUCOSAL | Status: DC | PRN

## 2021-05-03 MED ORDER — PHENYLEPHRINE 40 MCG/ML (10ML) SYRINGE FOR IV PUSH (FOR BLOOD PRESSURE SUPPORT)
PREFILLED_SYRINGE | INTRAVENOUS | Status: DC | PRN
  Administered 2021-05-03: 80 ug via INTRAVENOUS

## 2021-05-03 MED ORDER — RIVAROXABAN 10 MG PO TABS
10.0000 mg | ORAL_TABLET | Freq: Every day | ORAL | Status: DC
  Administered 2021-05-04: 10 mg via ORAL
  Filled 2021-05-03: qty 1

## 2021-05-03 MED ORDER — OXYCODONE HCL ER 10 MG PO T12A
10.0000 mg | EXTENDED_RELEASE_TABLET | Freq: Two times a day (BID) | ORAL | Status: DC
  Administered 2021-05-03 – 2021-05-04 (×2): 10 mg via ORAL
  Filled 2021-05-03 (×2): qty 1

## 2021-05-03 MED ORDER — ONDANSETRON HCL 4 MG PO TABS: 4.0000 mg | ORAL_TABLET | Freq: Four times a day (QID) | ORAL | Status: DC | PRN

## 2021-05-03 MED ORDER — ONDANSETRON HCL 4 MG/2ML IJ SOLN: 4.0000 mg | Freq: Four times a day (QID) | INTRAMUSCULAR | Status: DC | PRN

## 2021-05-03 MED ORDER — BUPIVACAINE-MELOXICAM ER 400-12 MG/14ML IJ SOLN
INTRAMUSCULAR | Status: DC | PRN
  Administered 2021-05-03: 200 mg

## 2021-05-03 MED ORDER — FENTANYL CITRATE (PF) 100 MCG/2ML IJ SOLN
25.0000 ug | INTRAMUSCULAR | Status: DC | PRN
  Administered 2021-05-03: 50 ug via INTRAVENOUS

## 2021-05-03 MED ORDER — DIPHENHYDRAMINE HCL 12.5 MG/5ML PO ELIX
25.0000 mg | ORAL_SOLUTION | ORAL | Status: DC | PRN
  Filled 2021-05-03: qty 10

## 2021-05-03 MED ORDER — MIDAZOLAM HCL 5 MG/5ML IJ SOLN
INTRAMUSCULAR | Status: DC | PRN
Start: 2021-05-03 — End: 2021-05-03
  Administered 2021-05-03: 2 mg via INTRAVENOUS

## 2021-05-03 MED ORDER — 0.9 % SODIUM CHLORIDE (POUR BTL) OPTIME
TOPICAL | Status: DC | PRN
  Administered 2021-05-03: 1000 mL

## 2021-05-03 MED ORDER — METOCLOPRAMIDE HCL 5 MG PO TABS
5.0000 mg | ORAL_TABLET | Freq: Three times a day (TID) | ORAL | Status: DC | PRN
Start: 2021-05-03 — End: 2021-05-04

## 2021-05-03 SURGICAL SUPPLY — 61 items
BAG COUNTER SPONGE SURGICOUNT (BAG) ×2 IMPLANT
BAG DECANTER FOR FLEXI CONT (MISCELLANEOUS) ×2 IMPLANT
CELLS DAT CNTRL 66122 CELL SVR (MISCELLANEOUS) IMPLANT
COVER PERINEAL POST (MISCELLANEOUS) ×2 IMPLANT
COVER SURGICAL LIGHT HANDLE (MISCELLANEOUS) ×2 IMPLANT
CUP SECTOR GRIPTON 58MM (Orthopedic Implant) ×2 IMPLANT
DERMABOND ADVANCED (GAUZE/BANDAGES/DRESSINGS) ×1
DERMABOND ADVANCED .7 DNX12 (GAUZE/BANDAGES/DRESSINGS) ×1 IMPLANT
DRAPE C-ARM 42X72 X-RAY (DRAPES) ×2 IMPLANT
DRAPE POUCH INSTRU U-SHP 10X18 (DRAPES) ×2 IMPLANT
DRAPE STERI IOBAN 125X83 (DRAPES) ×2 IMPLANT
DRAPE U-SHAPE 47X51 STRL (DRAPES) ×4 IMPLANT
DRESSING AQUACEL AG SP 3.5X10 (GAUZE/BANDAGES/DRESSINGS) ×1 IMPLANT
DRSG AQUACEL AG ADV 3.5X10 (GAUZE/BANDAGES/DRESSINGS) ×2 IMPLANT
DRSG AQUACEL AG SP 3.5X10 (GAUZE/BANDAGES/DRESSINGS) ×2
DURAPREP 26ML APPLICATOR (WOUND CARE) ×4 IMPLANT
ELECT BLADE 4.0 EZ CLEAN MEGAD (MISCELLANEOUS) ×2
ELECT REM PT RETURN 9FT ADLT (ELECTROSURGICAL) ×2
ELECTRODE BLDE 4.0 EZ CLN MEGD (MISCELLANEOUS) ×1 IMPLANT
ELECTRODE REM PT RTRN 9FT ADLT (ELECTROSURGICAL) ×1 IMPLANT
GLOVE SURG LTX SZ7 (GLOVE) ×4 IMPLANT
GLOVE SURG SYN 7.5  E (GLOVE) ×4
GLOVE SURG SYN 7.5 E (GLOVE) ×4 IMPLANT
GLOVE SURG UNDER POLY LF SZ7 (GLOVE) ×40 IMPLANT
GLOVE SURG UNDER POLY LF SZ7.5 (GLOVE) ×4 IMPLANT
GOWN STRL REIN XL XLG (GOWN DISPOSABLE) ×2 IMPLANT
GOWN STRL REUS W/ TWL LRG LVL3 (GOWN DISPOSABLE) IMPLANT
GOWN STRL REUS W/ TWL XL LVL3 (GOWN DISPOSABLE) ×1 IMPLANT
GOWN STRL REUS W/TWL LRG LVL3 (GOWN DISPOSABLE)
GOWN STRL REUS W/TWL XL LVL3 (GOWN DISPOSABLE) ×1
HANDPIECE INTERPULSE COAX TIP (DISPOSABLE) ×1
HEAD CERAMIC 36 PLUS 8.5 12 14 (Hips) ×2 IMPLANT
HOOD PEEL AWAY FLYTE STAYCOOL (MISCELLANEOUS) ×4 IMPLANT
IV NS IRRIG 3000ML ARTHROMATIC (IV SOLUTION) ×2 IMPLANT
JET LAVAGE IRRISEPT WOUND (IRRIGATION / IRRIGATOR) ×2
KIT BASIN OR (CUSTOM PROCEDURE TRAY) ×2 IMPLANT
LAVAGE JET IRRISEPT WOUND (IRRIGATION / IRRIGATOR) ×1 IMPLANT
LINER NEUTRAL 36X58 PLUS4 ×2 IMPLANT
MARKER SKIN DUAL TIP RULER LAB (MISCELLANEOUS) ×2 IMPLANT
NEEDLE SPNL 18GX3.5 QUINCKE PK (NEEDLE) ×2 IMPLANT
PACK TOTAL JOINT (CUSTOM PROCEDURE TRAY) ×2 IMPLANT
PACK UNIVERSAL I (CUSTOM PROCEDURE TRAY) ×2 IMPLANT
RTRCTR WOUND ALEXIS 18CM MED (MISCELLANEOUS)
SAW OSC TIP CART 19.5X105X1.3 (SAW) ×2 IMPLANT
SCREW 6.5MMX25MM (Screw) ×2 IMPLANT
SET HNDPC FAN SPRY TIP SCT (DISPOSABLE) ×1 IMPLANT
STAPLER VISISTAT 35W (STAPLE) IMPLANT
STEM FEMORAL SZ5 HIGH ACTIS (Stem) ×2 IMPLANT
SUT ETHIBOND 2 V 37 (SUTURE) ×2 IMPLANT
SUT VIC AB 0 CT1 27 (SUTURE) ×1
SUT VIC AB 0 CT1 27XBRD ANBCTR (SUTURE) ×1 IMPLANT
SUT VIC AB 1 CTX 36 (SUTURE) ×1
SUT VIC AB 1 CTX36XBRD ANBCTR (SUTURE) ×1 IMPLANT
SUT VIC AB 2-0 CT1 27 (SUTURE) ×4
SUT VIC AB 2-0 CT1 TAPERPNT 27 (SUTURE) ×2 IMPLANT
SYR 50ML LL SCALE MARK (SYRINGE) ×2 IMPLANT
TOWEL GREEN STERILE (TOWEL DISPOSABLE) ×2 IMPLANT
TRAY CATH 16FR W/PLASTIC CATH (SET/KITS/TRAYS/PACK) IMPLANT
TRAY FOLEY W/BAG SLVR 16FR (SET/KITS/TRAYS/PACK) ×1
TRAY FOLEY W/BAG SLVR 16FR ST (SET/KITS/TRAYS/PACK) ×1 IMPLANT
YANKAUER SUCT BULB TIP NO VENT (SUCTIONS) ×2 IMPLANT

## 2021-05-03 NOTE — Anesthesia Preprocedure Evaluation (Signed)
Anesthesia Evaluation  Patient identified by MRN, date of birth, ID band Patient awake    Reviewed: Allergy & Precautions, NPO status , Patient's Chart, lab work & pertinent test results  History of Anesthesia Complications Negative for: history of anesthetic complications  Airway Mallampati: II  TM Distance: >3 FB Neck ROM: Full    Dental  (+) Missing,    Pulmonary asthma , Current Smoker and Patient abstained from smoking.,    Pulmonary exam normal        Cardiovascular hypertension, Pt. on medications Normal cardiovascular exam     Neuro/Psych Seizures -,  Anxiety    GI/Hepatic Neg liver ROS, GERD  Medicated and Controlled,  Endo/Other  negative endocrine ROS  Renal/GU negative Renal ROS  negative genitourinary   Musculoskeletal  (+) Arthritis ,   Abdominal   Peds  Hematology negative hematology ROS (+)   Anesthesia Other Findings Day of surgery medications reviewed with patient.  Reproductive/Obstetrics negative OB ROS                             Anesthesia Physical Anesthesia Plan  ASA: 2  Anesthesia Plan: Spinal   Post-op Pain Management:    Induction:   PONV Risk Score and Plan: 2 and Treatment may vary due to age or medical condition, Ondansetron, Propofol infusion, Dexamethasone and Midazolam  Airway Management Planned: Natural Airway and Simple Face Mask  Additional Equipment: None  Intra-op Plan:   Post-operative Plan:   Informed Consent: I have reviewed the patients History and Physical, chart, labs and discussed the procedure including the risks, benefits and alternatives for the proposed anesthesia with the patient or authorized representative who has indicated his/her understanding and acceptance.       Plan Discussed with: CRNA  Anesthesia Plan Comments:         Anesthesia Quick Evaluation

## 2021-05-03 NOTE — Discharge Instructions (Signed)

## 2021-05-03 NOTE — H&P (Signed)
PREOPERATIVE H&P  Chief Complaint: left hip degenerative joint disease  HPI: Darren Larsen is a 55 y.o. male who presents for surgical treatment of left hip degenerative joint disease.  He denies any changes in medical history.  Past Medical History:  Diagnosis Date   Anxiety    Arthritis    hands   Asthma    Cataract    GERD (gastroesophageal reflux disease)    History of concussion    per pt concussion without LOC 1980s -- no residuals   History of seizure    none in 30 years ago- none since then   Hypertension    Retinitis pigmentosa congenital    Seizures ([place])    Spermatocele    left   Spermatocele of epididymis, unspecified 04/17/2018   Wears glasses    Past Surgical History:  Procedure Laterality Date   COLONOSCOPY     greater than 30 years   INGUINAL HERNIA REPAIR Left infant;  age 36;  64s   SPERMATOCELECTOMY Left 06/26/2018   Procedure: SPERMATOCELECTOMY;  Surgeon: Ceasar Mons, MD;  Location: [facility];  Service: Urology;  Laterality: Left;   Social History   Socioeconomic History   Marital status: Legally Separated    Spouse name: Not on file   Number of children: 5   Years of education: 12    Highest education level: Not on file  Occupational History   Occupation: Carpentry    Occupation: Ambulance person   Tobacco Use   Smoking status: Some Days    Packs/day: 0.50    Years: 20.00    Pack years: 10.00    Types: Cigarettes   Smokeless tobacco: Never   Tobacco comments:    recently stopped smoking approx. 06-14-2018  Vaping Use   Vaping Use: Never used  Substance and Sexual Activity   Alcohol use: Yes    Comment: pt states he drinks "about a fifth evry week"   Drug use: Yes    Types: Marijuana    Comment: twice a week, if that, per pt   Sexual activity: Not on file  Other Topics Concern   Not on file  Social History Narrative   Live with niece   Children grown and gone.   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    Social Determinants of Health   Financial Resource Strain: Not on file  Food Insecurity: Not on file  Transportation Needs: Not on file  Physical Activity: Not on file  Stress: Not on file  Social Connections: Not on file   Family History  Problem Relation Age of Onset   Hypertension Mother    Colon cancer Neg Hx    Esophageal cancer Neg Hx    Rectal cancer Neg Hx    Stomach cancer Neg Hx    No Known Allergies Prior to Admission medications   Medication Sig Start Date End Date Taking? Authorizing Provider  amLODipine (NORVASC) 10 MG tablet Take 1 tablet (10 mg total) by mouth daily. to lower blood pressure 11/24/20 05/20/21 Yes Elsie Stain, MD  chlorthalidone (HYGROTON) 25 MG tablet Take 1 tablet (25 mg total) by mouth daily. 11/24/20 11/24/21 Yes Elsie Stain, MD  diclofenac Sodium (VOLTAREN) 1 % GEL Apply 1 application topically 4 (four) times daily as needed (pain).   Yes [provider]  docusate sodium (COLACE) 100 MG capsule Take 1 capsule (100 mg  total) by mouth daily as needed. 04/19/21 04/19/22  Aundra Dubin, PA-C  fexofenadine (ALLEGRA) 180 MG tablet Take 180 mg by mouth daily as needed for allergies or rhinitis.   Yes [provider]  gabapentin (NEURONTIN) 300 MG capsule TAKE 2 CAPSULES (600 MG TOTAL) BY MOUTH 3 (THREE) TIMES DAILY. FOR NERVE PAIN Patient taking differently: Take 600 mg by mouth 3 (three) times daily as needed (pain). 11/24/20 11/24/21 Yes Elsie Stain, MD  ibuprofen (ADVIL) 200 MG tablet Take 800 mg by mouth every 6 (six) hours as needed for headache or moderate pain.   Yes [provider]  methocarbamol (ROBAXIN) 500 MG tablet Take 1 tablet (500 mg total) by mouth 2 (two) times daily as needed. To be taken after surgery Patient taking differently: Take 500 mg by mouth every 6 (six) hours as needed. To be taken after surgery 04/19/21  Yes Aundra Dubin, PA-C   Multiple Vitamins-Minerals (ZINC PO) Take 1 tablet by mouth daily.   Yes [provider]  naproxen (NAPROSYN) 500 MG tablet TAKE 1 TABLET (500 MG TOTAL) BY MOUTH 2 (TWO) TIMES DAILY WITH A MEAL. AS NEEDED FOR PAIN Patient taking differently: Take 500 mg by mouth 2 (two) times daily. 11/24/20 11/24/21 Yes Elsie Stain, MD  omeprazole (PRILOSEC) 20 MG capsule TAKE 1 CAPSULE (20 MG TOTAL) BY MOUTH DAILY. FOR STOMACH PROTECTION 11/24/20 11/24/21 Yes Elsie Stain, MD  ondansetron (ZOFRAN) 4 MG tablet Take 1 tablet (4 mg total) by mouth every 8 (eight) hours as needed for nausea or vomiting. 04/19/21   Aundra Dubin, PA-C  oxyCODONE-acetaminophen (PERCOCET) 5-325 MG tablet Take 1 tablet by mouth every 4 (four) hours as needed. 04/20/21  Yes Aundra Dubin, PA-C  rivaroxaban (XARELTO) 10 MG TABS tablet Take 1 tablet (10 mg total) by mouth daily. To be taken after surgery 04/19/21   Aundra Dubin, PA-C  Vitamin D-Vitamin K (VITAMIN K2-VITAMIN D3 PO) Take 1 capsule by mouth daily.   Yes [provider]  Omega-3 1000 MG CAPS Take 2 capsules (2,000 mg total) by mouth daily. Patient not taking: No sig reported 11/24/20   Elsie Stain, MD  thiamine (VITAMIN B-1) 100 MG tablet Take 1 tablet (100 mg total) by mouth daily. Patient not taking: No sig reported 11/24/20   Elsie Stain, MD     Positive ROS: All other systems have been reviewed and were otherwise negative with the exception of those mentioned in the HPI and as above.  Physical Exam: General: Alert, no acute distress Cardiovascular: No pedal edema Respiratory: No cyanosis, no use of accessory musculature GI: abdomen soft Skin: No lesions in the area of chief complaint Neurologic: Sensation intact distally Psychiatric: Patient is competent for consent with normal mood and affect Lymphatic: no lymphedema  MUSCULOSKELETAL: exam stable  Assessment: left hip degenerative joint disease  Plan: Plan for  Procedure(s): LEFT TOTAL HIP ARTHROPLASTY ANTERIOR APPROACH  The risks benefits and alternatives were discussed with the patient including but not limited to the risks of nonoperative treatment, versus surgical intervention including infection, bleeding, nerve injury,  blood clots, cardiopulmonary complications, morbidity, mortality, among others, and they were willing to proceed.   Preoperative templating of the joint replacement has been completed, documented, and submitted to the Operating Room personnel in order to optimize intra-operative equipment management.   Eduard Roux, MD 05/03/2021 1:51 PM

## 2021-05-03 NOTE — Op Note (Signed)
LEFT TOTAL HIP ARTHROPLASTY ANTERIOR APPROACH  Procedure Note Dre Gamino   [id number]  Pre-op Diagnosis: left hip degenerative joint disease     Post-op Diagnosis: same   Operative Procedures  1. Total hip replacement; Left hip; uncemented [id number]   Surgeon: Frankey Shown, M.D.  Assist: Madalyn Rob, PA-C   Anesthesia: general, local  Prosthesis: Depuy Acetabulum: Pinnacle 58 mm Femur: Actis 5 HO Head: 36 mm size: +8.5 Liner: +4 Bearing Type: ceramic on poly  Total Hip Arthroplasty (Anterior Approach) Op Note:  After informed consent was obtained and the operative extremity marked in the holding area, the patient was brought back to the operating room and placed supine on the HANA table. Next, the operative extremity was prepped and draped in normal sterile fashion. Surgical timeout occurred verifying patient identification, surgical site, surgical procedure and administration of antibiotics.  A modified anterior Smith-Peterson approach to the hip was performed, using the interval between tensor fascia lata and sartorius.  Dissection was carried bluntly down onto the anterior hip capsule. The lateral femoral circumflex vessels were identified and coagulated. A capsulotomy was performed and the capsular flaps tagged for later repair.  The neck osteotomy was performed. The femoral head was removed which showed severe wear, the acetabular rim was cleared of soft tissue and attention was turned to reaming the acetabulum.  Sequential reaming was performed under fluoroscopic guidance. We reamed to a size 57 mm, and then impacted the acetabular shell. A 25 mm cancellous screw was placed through the shell for added fixation.  The liner was then placed after irrigation and attention turned to the femur.  After placing the femoral hook, the leg was taken to externally rotated, extended and adducted position taking care to perform soft tissue releases to allow for adequate mobilization  of the femur. Soft tissue was cleared from the shoulder of the greater trochanter and the hook elevator used to improve exposure of the proximal femur. Sequential broaching performed up to a size 5. Trial neck and head were placed. The leg was brought back up to neutral and the construct reduced.  Antibiotic irrigation was placed in the surgical wound and kept for at least 1 minute.  The position and sizing of components, offset and leg lengths were checked using fluoroscopy. Stability of the construct was checked in extension and external rotation without any subluxation or impingement of prosthesis. We dislocated the prosthesis, dropped the leg back into position, removed trial components, and irrigated copiously. The final stem and head was then placed, the leg brought back up, the system reduced and fluoroscopy used to verify positioning.  We irrigated, obtained hemostasis and closed the capsule using #2 ethibond suture.  One gram of vancomycin powder was placed in the surgical bed.   One gram of topical tranexamic acid was injected into the joint.  The fascia was closed with #1 vicryl plus, the deep fat layer was closed with 0 vicryl, the subcutaneous layers closed with 2.0 Vicryl Plus and the skin closed with 2.0 nylon and dermabond. A sterile dressing was applied. The patient was awakened in the operating room and taken to recovery in stable condition.  All sponge, needle, and instrument counts were correct at the end of the case.   Tawanna Cooler, my PA, was a medical necessity for opening, closing, limb positioning, retracting, exposing, and overall facilitation and timely completion of the surgery.  Position: supine  Complications: see description of procedure.  Time Out: performed   Drains/Packing: none  Estimated blood loss: see anesthesia record  Returned to Recovery Room: in good condition.   Antibiotics: yes   Mechanical VTE (DVT) Prophylaxis: sequential compression devices, TED  thigh-high  Chemical VTE (DVT) Prophylaxis: xarelto   Fluid Replacement: see anesthesia record  Specimens Removed: 1 to pathology   Sponge and Instrument Count Correct? yes   PACU: portable radiograph - low AP   Plan/RTC: Return in 2 weeks for staple removal. Weight Bearing/Load Lower Extremity: full  Hip precautions: none Suture Removal: 2 weeks   N. Eduard Roux, MD [facility] 4:24 PM   Implant Name Type Inv. Item Serial No. Manufacturer Lot No. LRB No. Used Action  CUP SECTOR GRIPTON 58MM - [id number] Orthopedic Implant CUP SECTOR GRIPTON 58MM  [employer] [id number] Left 1 Implanted  LINER NEUTRAL 36X58 PLUS4 - [id number]  LINER NEUTRAL 36X58 PLUS4  [employer]PU[employer] ORTH[employer] [id number] Left 1 Implanted  SCREW 6.5MMX25MM - [id number] Screw SCREW 6.5MMX25MM  [employer] [id number] Left 1 Implanted  HEAD CERAMIC 36 PLUS 8.5 12 14  - [id number] Hips HEAD CERAMIC 36 PLUS 8.5 12 14   [employer]TH[employer] [id number] Left 1 Implanted  STEM FEMORAL SZ5 HIGH ACTIS - [id number] Stem STEM FEMORAL SZ5 HIGH ACTIS  [employer] [id number] Left 1 Implanted

## 2021-05-03 NOTE — Anesthesia Procedure Notes (Signed)
Procedure Name: Intubation Date/Time: 05/03/2021 2:58 PM Performed by: Griffin Dakin, CRNA Pre-anesthesia Checklist: Patient identified, Emergency Drugs available, Suction available and Patient being monitored Patient Re-evaluated:Patient Re-evaluated prior to induction Oxygen Delivery Method: Circle system utilized Preoxygenation: Pre-oxygenation with 100% oxygen Induction Type: IV induction Ventilation: Mask ventilation without difficulty Laryngoscope Size: Mac and 4 Grade View: Grade II Tube type: Oral Tube size: 7.5 mm Number of attempts: 1 Airway Equipment and Method: Stylet and Oral airway Placement Confirmation: ETT inserted through vocal cords under direct vision, positive ETCO2 and breath sounds checked- equal and bilateral Secured at: 23 cm Tube secured with: Tape Dental Injury: Teeth and Oropharynx as per pre-operative assessment

## 2021-05-03 NOTE — Transfer of Care (Signed)
Immediate Anesthesia Transfer of Care Note  Patient: Darren Larsen  Procedure(s) Performed: LEFT TOTAL HIP ARTHROPLASTY ANTERIOR APPROACH (Left: Hip)  Patient Location: PACU  Anesthesia Type:General  Level of Consciousness: awake, alert  and oriented  Airway & Oxygen Therapy: Patient Spontanous Breathing  Post-op Assessment: Report given to RN and Post -op Vital signs reviewed and stable  Post vital signs: Reviewed and stable  Last Vitals:  Vitals Value Taken Time  BP 109/82 05/03/21 1652  Temp    Pulse 77 05/03/21 1653  Resp 16 05/03/21 1653  SpO2 94 % 05/03/21 1653  Vitals shown include unvalidated device data.  Last Pain:  Vitals:   05/03/21 1309  TempSrc:   PainSc: 8       Patients Stated Pain Goal: 4 (72/07/21 8288)  Complications: No notable events documented.

## 2021-05-04 DIAGNOSIS — M1612 Unilateral primary osteoarthritis, left hip: Secondary | ICD-10-CM | POA: Diagnosis not present

## 2021-05-04 LAB — BASIC METABOLIC PANEL
Anion gap: 7 (ref 5–15)
BUN: 9 mg/dL (ref 6–20)
CO2: 26 mmol/L (ref 22–32)
Calcium: 9.4 mg/dL (ref 8.9–10.3)
Chloride: 100 mmol/L (ref 98–111)
Creatinine, Ser: 0.91 mg/dL (ref 0.61–1.24)
GFR, Estimated: >60 mL/min (ref >60)
Glucose, Bld: 118 mg/dL — ABNORMAL HIGH (ref 70–99)
Potassium: 4 mmol/L (ref 3.5–5.1)
Sodium: 133 mmol/L — ABNORMAL LOW (ref 135–145)

## 2021-05-04 LAB — CBC
HCT: 37 % — ABNORMAL LOW (ref 39.0–52.0)
Hemoglobin: 12 g/dL — ABNORMAL LOW (ref 13.0–17.0)
MCH: 23.9 pg — ABNORMAL LOW (ref 26.0–34.0)
MCHC: 32.4 g/dL (ref 30.0–36.0)
MCV: 73.6 fL — ABNORMAL LOW (ref 80.0–100.0)
Platelets: 192 10*3/uL (ref 150–400)
RBC: 5.03 MIL/uL (ref 4.22–5.81)
RDW: 16.2 % — ABNORMAL HIGH (ref 11.5–15.5)
WBC: 14.1 10*3/uL — ABNORMAL HIGH (ref 4.0–10.5)
nRBC: 0.1 % (ref 0.0–0.2)

## 2021-05-04 NOTE — Discharge Summary (Signed)
Patient ID: Darren Larsen MRN: [id number] DOB/AGE: Jul 31, 1965 55 y.o.  Admit date: 05/03/2021 Discharge date: 05/04/2021  Admission Diagnoses:  Principal Problem:   Primary osteoarthritis of left hip Active Problems:   Status post total replacement of left hip   Discharge Diagnoses:  Same  Past Medical History:  Diagnosis Date   Anxiety    Arthritis    hands   Asthma    Cataract    GERD (gastroesophageal reflux disease)    History of concussion    per pt concussion without LOC 1980s -- no residuals   History of seizure    none in 30 years ago- none since then   Hypertension    Retinitis pigmentosa congenital    Seizures ([place])    Spermatocele    left   Spermatocele of epididymis, unspecified 04/17/2018   Wears glasses     Surgeries: Procedure(s): LEFT TOTAL HIP ARTHROPLASTY ANTERIOR APPROACH on 05/03/2021   Consultants:   Discharged Condition: Improved  Hospital Course: Creg Gilmer is an 55 y.o. male who was admitted 05/03/2021 for operative treatment ofPrimary osteoarthritis of left hip. Patient has severe unremitting pain that affects sleep, daily activities, and work/hobbies. After pre-op clearance the patient was taken to the operating room on 05/03/2021 and underwent  Procedure(s): LEFT TOTAL HIP ARTHROPLASTY ANTERIOR APPROACH.    Patient was given perioperative antibiotics:  Anti-infectives (From admission, onward)    Start     Dose/Rate Route Frequency Ordered Stop   05/04/21 0600  ceFAZolin (ANCEF) IVPB 2g/100 mL premix        2 g 200 mL/hr over 30 Minutes Intravenous On call to O.R. 05/03/21 1250 05/04/21 0534   05/03/21 2100  ceFAZolin (ANCEF) IVPB 2g/100 mL premix        2 g 200 mL/hr over 30 Minutes Intravenous Every 6 hours 05/03/21 1819 05/04/21 0422   05/03/21 1525  vancomycin (VANCOCIN) powder  Status:  Discontinued          As needed 05/03/21 1526 05/03/21 1645        Patient was given sequential compression devices, early ambulation, and  chemoprophylaxis to prevent DVT.  Patient benefited maximally from hospital stay and there were no complications.    Recent vital signs: Patient Vitals for the past 24 hrs:  BP Temp Temp src Pulse Resp SpO2 Height Weight  05/04/21 0755 (!) 138/95 97.8 F (36.6 C) Oral 88 16 100 % -- --  05/04/21 0440 (!) 132/98 98.1 F (36.7 C) Oral 83 18 98 % -- --  05/03/21 2321 119/90 98.1 F (36.7 C) Oral 84 20 99 % -- --  05/03/21 2040 109/80 97.8 F (36.6 C) Oral 98 18 98 % -- --  05/03/21 1828 (!) 123/93 97.6 F (36.4 C) Oral 78 17 100 % -- --  05/03/21 1805 (!) 122/95 98 F (36.7 C) -- 79 13 96 % -- --  05/03/21 1750 115/86 -- -- 78 13 97 % -- --  05/03/21 1735 123/86 -- -- 76 10 97 % -- --  05/03/21 1720 116/87 -- -- 74 11 96 % -- --  05/03/21 1705 108/89 -- -- 78 18 92 % -- --  05/03/21 1650 109/82 98.3 F (36.8 C) -- 77 16 95 % -- --  05/03/21 1224 (!) 142/99 97.7 F (36.5 C) Oral 89 18 97 % 5\' 9"  (1.753 m) 106.1 kg     Recent laboratory studies:  Recent Labs    05/04/21 0501  WBC 14.1*  HGB 12.0*  HCT 37.0*  PLT 192  NA 133*  K 4.0  CL 100  CO2 26  BUN 9  CREATININE 0.91  GLUCOSE 118*  CALCIUM 9.4     Discharge Medications:   Allergies as of 05/04/2021   No Known Allergies      Medication List     STOP taking these medications    ibuprofen 200 MG tablet Commonly known as: ADVIL   naproxen 500 MG tablet Commonly known as: NAPROSYN   Omega-3 1000 MG Caps       TAKE these medications    amLODipine 10 MG tablet Commonly known as: NORVASC Take 1 tablet (10 mg total) by mouth daily. to lower blood pressure   chlorthalidone 25 MG tablet Commonly known as: HYGROTON Take 1 tablet (25 mg total) by mouth daily.   diclofenac Sodium 1 % Gel Commonly known as: VOLTAREN Apply 1 application topically 4 (four) times daily as needed (pain).   docusate sodium 100 MG capsule Commonly known as: Colace Take 1 capsule (100 mg total) by mouth daily as needed.    fexofenadine 180 MG tablet Commonly known as: ALLEGRA Take 180 mg by mouth daily as needed for allergies or rhinitis.   gabapentin 300 MG capsule Commonly known as: NEURONTIN TAKE 2 CAPSULES (600 MG TOTAL) BY MOUTH 3 (THREE) TIMES DAILY. FOR NERVE PAIN What changed:  how much to take how to take this when to take this reasons to take this   methocarbamol 500 MG tablet Commonly known as: Robaxin Take 1 tablet (500 mg total) by mouth 2 (two) times daily as needed. To be taken after surgery What changed: when to take this   omeprazole 20 MG capsule Commonly known as: PRILOSEC TAKE 1 CAPSULE (20 MG TOTAL) BY MOUTH DAILY. FOR STOMACH PROTECTION   ondansetron 4 MG tablet Commonly known as: Zofran Take 1 tablet (4 mg total) by mouth every 8 (eight) hours as needed for nausea or vomiting.   oxyCODONE-acetaminophen 5-325 MG tablet Commonly known as: Percocet Take 1 tablet by mouth every 4 (four) hours as needed.   thiamine 100 MG tablet Commonly known as: Vitamin B-1 Take 1 tablet (100 mg total) by mouth daily.   VITAMIN K2-VITAMIN D3 PO Take 1 capsule by mouth daily.   Xarelto 10 MG Tabs tablet Generic drug: rivaroxaban Take 1 tablet (10 mg total) by mouth daily. To be taken after surgery   ZINC PO Take 1 tablet by mouth daily.               Durable Medical Equipment  (From admission, onward)           Start     Ordered   05/03/21 1820  DME Walker rolling  Once       Question:  Patient needs a walker to treat with the following condition  Answer:  History of hip replacement   05/03/21 1819   05/03/21 1820  DME 3 n 1  Once        05/03/21 1819   05/03/21 1820  DME Bedside commode  Once       Question:  Patient needs a bedside commode to treat with the following condition  Answer:  History of hip replacement   05/03/21 1819            Diagnostic Studies: DG Pelvis Portable  Result Date: 05/03/2021 CLINICAL DATA:  LEFT TOTAL HIP ARTHROPLASTY  ANTERIOR APPROACH Post op images EXAM: PORTABLE PELVIS 1-2 VIEWS COMPARISON:  None.  FINDINGS: LEFT hip total arthroplasty.  No fracture or dislocation. IMPRESSION: No complication following LEFT total hip arthroplasty. Electronically Signed   By: Suzy Bouchard M.D.   On: 05/03/2021 20:29   DG C-Arm 1-60 Min-No Report  Result Date: 05/03/2021 Fluoroscopy was utilized by the requesting physician.  No radiographic interpretation.   DG C-Arm 1-60 Min-No Report  Result Date: 05/03/2021 Fluoroscopy was utilized by the requesting physician.  No radiographic interpretation.    Disposition: Discharge disposition: 01-Home or Self Care          Follow-up Information     Leandrew Koyanagi, MD. Schedule an appointment as soon as possible for a visit in 2 week(s).   Specialty: Orthopedic Surgery Contact information: [address] [place] [address] [phone]                  Signed: Aundra Dubin 05/04/2021, 8:05 AM

## 2021-05-04 NOTE — Evaluation (Addendum)
Physical Therapy Evaluation Patient Details Name: Darren Larsen MRN: [id number] DOB: 1965/09/09 Today's Date: 05/04/2021  History of Present Illness  Pt is a 55 y.o. M s/p left total hip arthroplasty 05/03/2021. Significant PMH: seizure, HTN.  Clinical Impression  Pt admitted s/p left THA. Prior to admission, pt lives with his niece in an apartment with two flights to enter. He reports little to no family assist upon discharge home. Pt was able to perform HEP for left lower extremity strengthening and ROM (written handout provided). Pt ambulating 200 feet with a walker at a supervision level. Demonstrates gait abnormalities and LLE weakness. Will benefit from stair training prior to discharge.      Recommendations for follow up therapy are one component of a multi-disciplinary discharge planning process, led by the attending physician.  Recommendations may be updated based on patient status, additional functional criteria and insurance authorization.  Follow Up Recommendations Follow surgeon's plan    Assistance Recommended at Discharge PRN  Functional Status Assessment Patient has had a recent decline in their functional status and demonstrates the ability to make significant improvements in function in a reasonable and predictable amount of time.   Equipment Recommendations  Rolling walker (2 wheels)    Recommendations for Other Services       Precautions / Restrictions Precautions Precautions: Fall Restrictions Weight Bearing Restrictions: No      Mobility  Bed Mobility Overal bed mobility: Needs Assistance Bed Mobility: Supine to Sit;Sit to Supine     Supine to sit: Supervision Sit to supine: Min assist   General bed mobility comments: Taught looping a bed sheet around leg to negotiate on/off bed for increased independence    Transfers Overall transfer level: Needs assistance Equipment used: Rolling walker (2 wheels) Transfers: Sit to/from Stand Sit to Stand:  Supervision                Ambulation/Gait Ambulation/Gait assistance: Min guard Gait Distance (Feet): 200 Feet Assistive device: Rolling walker (2 wheels) Gait Pattern/deviations: Step-through pattern;Decreased stance time - left;Decreased weight shift to left;Antalgic;Decreased step length - right Gait velocity: decreased   General Gait Details: Cues for walker use, proximity, equal step lengths. Pt with decreased fluidity of gait, antalgic pattern  Stairs            Wheelchair Mobility    Modified Rankin (Stroke Patients Only)       Balance Overall balance assessment: Needs assistance Sitting-balance support: Feet supported Sitting balance-Leahy Scale: Good     Standing balance support: During functional activity;No upper extremity supported Standing balance-Leahy Scale: Fair                               Pertinent Vitals/Pain Pain Assessment: Faces Faces Pain Scale: Hurts whole lot Pain Location: surgical site Pain Descriptors / Indicators: Grimacing;Operative site guarding;Sore Pain Intervention(s): Limited activity within patient's tolerance;Monitored during session;Ice applied    Home Living Family/patient expects to be discharged to:: Private residence Living Arrangements: Other relatives (niece) Available Help at Discharge: Family;Available PRN/intermittently Type of Home: Apartment Home Access: Stairs to enter Entrance Stairs-Rails: Right;Left Entrance Stairs-Number of Steps:  (2 flights)   Home Layout: One level Home Equipment: None Additional Comments: Pt reports little to no help from family members due to various works schedules    Prior Function Prior Level of Function : Independent/Modified Independent  Hand Dominance        Extremity/Trunk Assessment   Upper Extremity Assessment Upper Extremity Assessment: Overall WFL for tasks assessed    Lower Extremity Assessment Lower Extremity  Assessment: RLE deficits/detail RLE Deficits / Details: S/p THA. Able to perform quad set, limited heel slide, ankle dorsiflexion WFL    Cervical / Trunk Assessment Cervical / Trunk Assessment: Normal  Communication   Communication: No difficulties  Cognition Arousal/Alertness: Awake/alert Behavior During Therapy: WFL for tasks assessed/performed Overall Cognitive Status: Within Functional Limits for tasks assessed                                          General Comments      Exercises Total Joint Exercises Ankle Circles/Pumps: Both;20 reps;Supine Quad Sets: Both;15 reps;Supine Heel Slides: Left;10 reps;Supine Hip ABduction/ADduction: Left;10 reps;Supine Long Arc Quad: Left;10 reps;Seated   Assessment/Plan    PT Assessment Patient needs continued PT services  PT Problem List Decreased strength;Decreased balance;Decreased mobility;Pain       PT Treatment Interventions DME instruction;Gait training;Stair training;Functional mobility training;Therapeutic activities;Therapeutic exercise;Balance training;Patient/family education    PT Goals (Current goals can be found in the Care Plan section)  Acute Rehab PT Goals Patient Stated Goal: be independent PT Goal Formulation: With patient Time For Goal Achievement: 05/18/21 Potential to Achieve Goals: Good    Frequency 7X/week   Barriers to discharge        Co-evaluation               AM-PAC PT "6 Clicks" Mobility  Outcome Measure Help needed turning from your back to your side while in a flat bed without using bedrails?: None Help needed moving from lying on your back to sitting on the side of a flat bed without using bedrails?: A Little Help needed moving to and from a bed to a chair (including a wheelchair)?: A Little Help needed standing up from a chair using your arms (e.g., wheelchair or bedside chair)?: A Little Help needed to walk in hospital room?: A Little Help needed climbing 3-5 steps  with a railing? : A Little 6 Click Score: 19    End of Session   Activity Tolerance: Patient tolerated treatment well Patient left: in bed;with call bell/phone within reach Nurse Communication: Mobility status PT Visit Diagnosis: Other abnormalities of gait and mobility (R26.89);Pain Pain - Right/Left: Left Pain - part of body: Hip    Time: 9798-9211 PT Time Calculation (min) (ACUTE ONLY): 19 min   Charges:   PT Evaluation $PT Eval Low Complexity: [place], PT, DPT Acute Rehabilitation Services Pager [phone] Office [phone]   Darren Larsen 05/04/2021, 9:44 AM

## 2021-05-04 NOTE — Progress Notes (Signed)
Subjective: 1 Day Post-Op Procedure(s) (LRB): LEFT TOTAL HIP ARTHROPLASTY ANTERIOR APPROACH (Left) Patient reports pain as moderate.    Objective: Vital signs in last 24 hours: Temp:  [97.6 F (36.4 C)-98.3 F (36.8 C)] 97.8 F (36.6 C) (10/25 0755) Pulse Rate:  [74-98] 88 (10/25 0755) Resp:  [10-20] 16 (10/25 0755) BP: (108-142)/(80-99) 138/95 (10/25 0755) SpO2:  [92 %-100 %] 100 % (10/25 0755) Weight:  [106.1 kg] 106.1 kg (10/24 1224)  Intake/Output from previous day: 10/24 0701 - 10/25 0700 In: -  Out: 250 [Drains:150; Blood:100] Intake/Output this shift: No intake/output data recorded.  Recent Labs    05/04/21 0501  HGB 12.0*   Recent Labs    05/04/21 0501  WBC 14.1*  RBC 5.03  HCT 37.0*  PLT 192   Recent Labs    05/04/21 0501  NA 133*  K 4.0  CL 100  CO2 26  BUN 9  CREATININE 0.91  GLUCOSE 118*  CALCIUM 9.4   No results for input(s): LABPT, INR in the last 72 hours.  Neurologically intact Neurovascular intact Sensation intact distally Intact pulses distally Dorsiflexion/Plantar flexion intact Incision: dressing C/D/I No cellulitis present Compartment soft   Assessment/Plan: 1 Day Post-Op Procedure(s) (LRB): LEFT TOTAL HIP ARTHROPLASTY ANTERIOR APPROACH (Left) Advance diet Up with therapy D/C IV fluids Discharge home with home health after second PT session as long as he does well with stair training (has two flights of stairs to climb to get into apartment) WBAT LLE ABLA- mild and stable      Aundra Dubin 05/04/2021, 8:03 AM

## 2021-05-04 NOTE — Evaluation (Signed)
Occupational Therapy Evaluation Patient Details Name: Darren Larsen MRN: [id number] DOB: August 23, 1965 Today's Date: 05/04/2021   History of Present Illness Pt is a 55 y.o. M s/p left total hip arthroplasty 05/03/2021. Significant PMH: seizure, HTN.   Clinical Impression   Pt presents with pain and decreased balance/activity tolerance. Pt currently requiring supervision for LB ADLs and functional transfers/mobility. Pt is independent at baseline and lives with his nieces who will only be available for limited assistance at home in apartment with 2 flights of stairs to enter. Pt educated on AE use and compensatory strategies for ADLs. Pt should be safe to return home from an OT standpoint without any further skilled OT services once medically cleared. No further skilled OT needs at this time. Will sign off.      Recommendations for follow up therapy are one component of a multi-disciplinary discharge planning process, led by the attending physician.  Recommendations may be updated based on patient status, additional functional criteria and insurance authorization.   Follow Up Recommendations  No OT follow up    Assistance Recommended at Discharge Intermittent Supervision/Assistance  Functional Status Assessment  Patient has had a recent decline in their functional status and demonstrates the ability to make significant improvements in function in a reasonable and predictable amount of time.  Equipment Recommendations  [facility]    Recommendations for Other Services       Precautions / Restrictions Precautions Precautions: Fall Restrictions Weight Bearing Restrictions: No      Mobility Bed Mobility Overal bed mobility: Needs Assistance Bed Mobility: Supine to Sit;Sit to Supine     Supine to sit: Supervision Sit to supine: Min assist       Transfers Overall transfer level: Needs assistance Equipment used: Rolling walker (2 wheels) Transfers: Sit to/from Stand Sit to Stand:  Supervision                  Balance Overall balance assessment: Needs assistance Sitting-balance support: Feet supported Sitting balance-Leahy Scale: Good     Standing balance support: During functional activity;No upper extremity supported Standing balance-Leahy Scale: Fair                             ADL either performed or assessed with clinical judgement   ADL Overall ADL's : Needs assistance/impaired Eating/Feeding: Independent;Sitting   Grooming: Supervision/safety;Standing   Upper Body Bathing: Independent;Sitting   Lower Body Bathing: Supervison/ safety;Sitting/lateral leans   Upper Body Dressing : Independent;Sitting   Lower Body Dressing: Supervision/safety;Sitting/lateral leans;Sit to/from stand   Toilet Transfer: Supervision/safety;Ambulation;Rolling walker (2 wheels);Grab bars   Toileting- Clothing Manipulation and Hygiene: Supervision/safety;Sitting/lateral lean;Sit to/from stand   Tub/ Shower Transfer: Tub transfer;Ambulation;Tub bench;Rolling walker (2 wheels);Supervision/safety   Functional mobility during ADLs: Min guard;Rolling walker (2 wheels) General ADL Comments: Pt limited by pain, balance, and activity tolerance. Pt has tub bench at home would should greatly improve safety/independence with tub transfers and showering. Pt able to complete LB dressing with increased time at bed level and supervision for safety with sit to stand method at EOB.     Vision   Vision Assessment?: No apparent visual deficits     Perception     Praxis      Pertinent Vitals/Pain Pain Assessment: 0-10 Pain Score: 10-Worst pain ever Faces Pain Scale: Hurts whole lot Pain Location: surgical site Pain Descriptors / Indicators: Grimacing;Operative site guarding;Sore Pain Intervention(s): Limited activity within patient's tolerance;Monitored during session;Repositioned     Hand  Dominance     Extremity/Trunk Assessment Upper Extremity  Assessment Upper Extremity Assessment: Overall WFL for tasks assessed      Cervical / Trunk Assessment Cervical / Trunk Assessment: Normal   Communication Communication Communication: No difficulties   Cognition Arousal/Alertness: Awake/alert Behavior During Therapy: WFL for tasks assessed/performed Overall Cognitive Status: Within Functional Limits for tasks assessed                                       General Comments       Exercises    Shoulder Instructions      Home Living Family/patient expects to be discharged to:: Private residence Living Arrangements: Other relatives Available Help at Discharge: Family;Available PRN/intermittently Type of Home: Apartment Home Access: Stairs to enter Entrance Stairs-Number of Steps: 2 flights Entrance Stairs-Rails: Right;Left Home Layout: One level     Bathroom Shower/Tub: Teacher, early years/pre: Standard     Home Equipment: Tub bench   Additional Comments: Pt reports little to no help from family members due to various works schedules      Prior Functioning/Environment Prior Level of Function : Independent/Modified Independent                        OT Problem List: Decreased activity tolerance;Impaired balance (sitting and/or standing);Decreased safety awareness;Decreased knowledge of use of DME or AE;Decreased knowledge of precautions;Pain      OT Treatment/Interventions:      OT Goals(Current goals can be found in the care plan section) Acute Rehab OT Goals Patient Stated Goal: return to independence OT Goal Formulation: With patient  OT Frequency:     Barriers to D/C:            Co-evaluation              AM-PAC OT "6 Clicks" Daily Activity     Outcome Measure Help from another person eating meals?: None Help from another person taking care of personal grooming?: A Little Help from another person toileting, which includes using toliet, bedpan, or urinal?: A  Little Help from another person bathing (including washing, rinsing, drying)?: A Little Help from another person to put on and taking off regular upper body clothing?: None Help from another person to put on and taking off regular lower body clothing?: A Little 6 Click Score: 20   End of Session Equipment Utilized During Treatment: Gait belt;Rolling walker (2 wheels) Nurse Communication: Mobility status  Activity Tolerance: Patient limited by pain Patient left: in bed;with call bell/phone within reach  OT Visit Diagnosis: Unsteadiness on feet (R26.81);Other abnormalities of gait and mobility (R26.89);Pain                Time: 2395-3202 OT Time Calculation (min): 28 min Charges:  OT General Charges $OT Visit: 1 Visit OT Evaluation $OT Eval Low Complexity: 1 Low  Thaddius Manes C, OT/L  Acute Rehab [place] 05/04/2021, 10:20 AM

## 2021-05-04 NOTE — Progress Notes (Addendum)
Physical Therapy Treatment Patient Details Name: Darren Larsen MRN: [id number] DOB: 01-25-1966 Today's Date: 05/04/2021   History of Present Illness Pt is a 55 y.o. M s/p left total hip arthroplasty 05/03/2021. Significant PMH: seizure, HTN.    PT Comments    Pt seen for additional session to practice stair training prior to discharge. Pt navigated 8 steps with a left railing, utilizing a sideways technique, in order to simulate entrance to apartment. Continued to reinforce activity and safety recommendations, in addition to recommendation for assist for IADL's. Pt with poor pain control at this time; RN notified. Will benefit from follow up PT to address further strengthening, gait training and functional mobility.     Recommendations for follow up therapy are one component of a multi-disciplinary discharge planning process, led by the attending physician.  Recommendations may be updated based on patient status, additional functional criteria and insurance authorization.  Follow Up Recommendations  Follow physician's recommendations for discharge plan and follow up therapies     Assistance Recommended at Discharge PRN  Equipment Recommendations  Rolling walker (2 wheels)    Recommendations for Other Services       Precautions / Restrictions Precautions Precautions: Fall Restrictions Weight Bearing Restrictions: No     Mobility  Bed Mobility Overal bed mobility: Modified Independent Bed Mobility: Supine to Sit;Sit to Supine           General bed mobility comments: Taught looping a bed sheet around leg to negotiate on/off bed for increased independence    Transfers Overall transfer level: Needs assistance Equipment used: Rolling walker (2 wheels) Transfers: Sit to/from Stand Sit to Stand: Supervision                Ambulation/Gait Ambulation/Gait assistance: Min guard Gait Distance (Feet): 75 Feet Assistive device: Rolling walker (2 wheels) Gait  Pattern/deviations: Step-through pattern;Decreased stance time - left;Decreased weight shift to left;Antalgic;Decreased step length - right Gait velocity: decreased   General Gait Details: Cues for walker use and proximity, decreased fluidity of gait   Stairs Stairs: Yes Stairs assistance: Min guard Stair Management: One rail Right Number of Stairs: 8 General stair comments: cues for sequencing and technique   Wheelchair Mobility    Modified Rankin (Stroke Patients Only)       Balance Overall balance assessment: Needs assistance Sitting-balance support: Feet supported Sitting balance-Leahy Scale: Good     Standing balance support: During functional activity;No upper extremity supported Standing balance-Leahy Scale: Fair                              Cognition Arousal/Alertness: Awake/alert Behavior During Therapy: WFL for tasks assessed/performed Overall Cognitive Status: Within Functional Limits for tasks assessed                                          Exercises      General Comments        Pertinent Vitals/Pain Pain Assessment: Faces Faces Pain Scale: Hurts whole lot Pain Location: surgical site Pain Descriptors / Indicators: Grimacing;Operative site guarding;Sore Pain Intervention(s): Limited activity within patient's tolerance;Monitored during session;Premedicated before session    Home Living                          Prior Function  PT Goals (current goals can now be found in the care plan section) Acute Rehab PT Goals Patient Stated Goal: be independent PT Goal Formulation: With patient Time For Goal Achievement: 05/18/21 Potential to Achieve Goals: Good Progress towards PT goals: Progressing toward goals    Frequency    7X/week      PT Plan Current plan remains appropriate    Co-evaluation              AM-PAC PT "6 Clicks" Mobility   Outcome Measure  Help needed turning  from your back to your side while in a flat bed without using bedrails?: None Help needed moving from lying on your back to sitting on the side of a flat bed without using bedrails?: A Little Help needed moving to and from a bed to a chair (including a wheelchair)?: A Little Help needed standing up from a chair using your arms (e.g., wheelchair or bedside chair)?: A Little Help needed to walk in hospital room?: A Little Help needed climbing 3-5 steps with a railing? : A Little 6 Click Score: 19    End of Session   Activity Tolerance: Patient limited by pain Patient left: in bed;with call bell/phone within reach Nurse Communication: Mobility status PT Visit Diagnosis: Other abnormalities of gait and mobility (R26.89);Pain Pain - Right/Left: Left Pain - part of body: Hip     Time: 9024-0973 PT Time Calculation (min) (ACUTE ONLY): 15 min  Charges:  $Gait Training: 8-22 mins                     Wyona Almas, PT, DPT Acute Rehabilitation Services Pager [phone] Office [phone]    [name] 05/04/2021, 4:33 PM

## 2021-05-04 NOTE — Anesthesia Postprocedure Evaluation (Signed)
Anesthesia Post Note  Patient: Darren Larsen  Procedure(s) Performed: LEFT TOTAL HIP ARTHROPLASTY ANTERIOR APPROACH (Left: Hip)     Patient location during evaluation: PACU Anesthesia Type: General Level of consciousness: awake and alert and oriented Pain management: pain level controlled Vital Signs Assessment: post-procedure vital signs reviewed and stable Respiratory status: spontaneous breathing, nonlabored ventilation and respiratory function stable Cardiovascular status: blood pressure returned to baseline Postop Assessment: no apparent nausea or vomiting Anesthetic complications: no   No notable events documented.         Marthenia Rolling

## 2021-05-04 NOTE — Progress Notes (Signed)
Patient alert and oriented, voiding adequately, skin clean, dry and intact without evidence of skin break down, or symptoms of complications - no redness or edema noted, only slight tenderness at site.  Patient states pain is manageable at time of discharge. Patient has an appointment with MD Nov. 8th

## 2021-05-05 ENCOUNTER — Telehealth: Payer: Self-pay

## 2021-05-05 NOTE — Telephone Encounter (Signed)
Transition Care Management Unsuccessful Follow-up Telephone Call  Date of discharge and from where:  05/04/2021, [facility]   Attempts:  1st Attempt  Reason for unsuccessful TCM follow-up call:  Left voice message on # [phone]. Call also placed to # [phone], the phone just rings fast busy, unable to leave a message.

## 2021-05-06 ENCOUNTER — Telehealth: Payer: Self-pay

## 2021-05-06 NOTE — Telephone Encounter (Signed)
Transition Care Management Follow-up Telephone Call Date of discharge and from where: 05/04/2021, [facility]  How have you been since you were released from the hospital? He said he is good, better than yesterday and he has been doing his exercises.  Any questions or concerns? No  Items Reviewed: Did the pt receive and understand the discharge instructions provided? Yes  Medications obtained and verified? Yes -he said he has all medications, nothing new prescribed. He did not have any questions about his med regime.  Other? No  Any new allergies since your discharge? No  Dietary orders reviewed? Yes Do you have support at home?  Lives alone but has family who checks on him   Home Care and Equipment/Supplies: Were home health services ordered? no If so, what is the name of the agency? N/a  Has the agency set up a time to come to the patient's home? not applicable Were any new equipment or medical supplies ordered?  No What is the name of the medical supply agency? N/a Were you able to get the supplies/equipment? not applicable Do you have any questions related to the use of the equipment or supplies? No  He said that the dressing remains intact on his left hip   Functional Questionnaire: (I = Independent and D = Dependent) ADLs: independent. Using RW with ambulation.   Follow up appointments reviewed:  PCP Hospital f/u appt confirmed?  He said that after he sees Dr Erlinda Hong, he will be going to [facility] to stay with his sister for a month and will call this clinic to schedule an appointment with Dr Joya Gaskins when he gets back.    [facility] f/u appt confirmed? Yes  Scheduled to see orthopedics on 05/18/2021. Are transportation arrangements needed? No . The phone number for [place] was text to him as he requested.  If their condition worsens, is the pt aware to call PCP or go to the Emergency Dept.? Yes Was the patient provided with contact information for the PCP's  office or ED? Yes Was to pt encouraged to call back with questions or concerns? Yes

## 2021-05-10 ENCOUNTER — Encounter (HOSPITAL_COMMUNITY): Payer: Self-pay | Admitting: Orthopaedic Surgery

## 2021-05-11 ENCOUNTER — Encounter: Payer: Medicaid Other | Admitting: Orthopaedic Surgery

## 2021-05-12 ENCOUNTER — Telehealth: Payer: Self-pay | Admitting: Orthopaedic Surgery

## 2021-05-12 NOTE — Telephone Encounter (Signed)
Patient called. He says he has 1 pain pill left. Would like a refill on oxycodone. His call back number is [phone]

## 2021-05-12 NOTE — Telephone Encounter (Signed)
Call placed to patient and provided him with the phone number for Dr Phoebe Sharps office to call regarding his pain medication.

## 2021-05-12 NOTE — Telephone Encounter (Signed)
Patient returning Darren Larsen call and states he has only 1 pain pill left DrMarland Kitchen Sherrian Divers was the prescribing doctor and does not know how to reach Dr. Sherrian Divers. Patient requesting to speak with Darren Larsen

## 2021-05-13 ENCOUNTER — Other Ambulatory Visit: Payer: Self-pay | Admitting: Physician Assistant

## 2021-05-13 MED ORDER — OXYCODONE-ACETAMINOPHEN 5-325 MG PO TABS: 1.0000 | ORAL_TABLET | Freq: Four times a day (QID) | ORAL | 0 refills | Status: DC | PRN

## 2021-05-13 NOTE — Telephone Encounter (Signed)
What pharmacy?  There are multiple in chart [facility]

## 2021-05-13 NOTE — Telephone Encounter (Signed)
[place], [address]  [place], [place] [address]  Phone:

## 2021-05-13 NOTE — Telephone Encounter (Signed)
Sent in

## 2021-05-18 ENCOUNTER — Other Ambulatory Visit: Payer: Self-pay

## 2021-05-18 ENCOUNTER — Encounter: Payer: Self-pay | Admitting: Orthopaedic Surgery

## 2021-05-18 ENCOUNTER — Ambulatory Visit (INDEPENDENT_AMBULATORY_CARE_PROVIDER_SITE_OTHER): Payer: Medicaid Other | Admitting: Orthopaedic Surgery

## 2021-05-18 DIAGNOSIS — M25552 Pain in left hip: Secondary | ICD-10-CM

## 2021-05-18 DIAGNOSIS — Z96642 Presence of left artificial hip joint: Secondary | ICD-10-CM | POA: Diagnosis not present

## 2021-05-18 MED ORDER — HYDROCODONE-ACETAMINOPHEN 5-325 MG PO TABS: 1.0000 | ORAL_TABLET | Freq: Every day | ORAL | 0 refills | Status: DC | PRN

## 2021-05-18 MED ORDER — LIDOCAINE HCL 1 % IJ SOLN
3.0000 mL | INTRAMUSCULAR | Status: AC | PRN
  Administered 2021-05-18: 3 mL

## 2021-05-18 MED ORDER — SULFAMETHOXAZOLE-TRIMETHOPRIM 800-160 MG PO TABS: 1.0000 | ORAL_TABLET | Freq: Two times a day (BID) | ORAL | 0 refills | Status: DC

## 2021-05-18 MED ORDER — BUPIVACAINE HCL 0.5 % IJ SOLN
3.0000 mL | INTRAMUSCULAR | Status: AC | PRN
  Administered 2021-05-18: 3 mL via INTRA_ARTICULAR

## 2021-05-18 NOTE — Progress Notes (Signed)
Office Visit Note   Patient: Darren Larsen           Date of Birth: [birth year]           MRN: [id number] Visit Date: 05/18/2021              Requested by: Elsie Stain, MD [address],  [place] [address] PCP: Elsie Stain, MD   Assessment & Plan: Visit Diagnoses:  1. Status post total replacement of left hip     Plan: Darren Larsen is doing well for his first visit.  I aspirated about 110 cc of postoperative seroma.  We will put him on 10 days of Bactrim.  I refilled his pain medication today.  Recheck in 4 weeks.  Continue to work on home exercises and ambulation.  Follow-Up Instructions: No follow-ups on file.   Orders:  Orders Placed This Encounter  Procedures   Large Joint Inj    Meds ordered this encounter  Medications   HYDROcodone-acetaminophen (NORCO) 5-325 MG tablet    Sig: Take 1-2 tablets by mouth daily as needed.    Dispense:  30 tablet    Refill:  0   sulfamethoxazole-trimethoprim (BACTRIM DS) 800-160 MG tablet    Sig: Take 1 tablet by mouth 2 (two) times daily.    Dispense:  20 tablet    Refill:  0       Procedures: Large Joint Inj: L hip joint on 05/18/2021 3:10 PM Indications: pain Details: 22 G needle Medications: 3 mL lidocaine 1 %; 3 mL bupivacaine 0.5 % Outcome: tolerated well, no immediate complications Patient was prepped and draped in the usual sterile fashion.      Clinical Data: No additional findings.   Subjective: Chief Complaint  Patient presents with   Left Hip - Routine Post Op    Darren Larsen is 2 weeks status post left total hip replacement.  Overall doing well has no real complaints.   Review of Systems   Objective: Vital Signs: There were no vitals taken for this visit.  Physical Exam  Ortho Exam  Left hip shows healed surgical incision.  No signs infection.  Moderate postsurgical seroma.  Specialty Comments:  No specialty comments available.  Imaging: No results found.   PMFS History: Patient  Active Problem List   Diagnosis Date Noted   Status post total replacement of left hip 05/03/2021   Primary osteoarthritis of left hip 04/14/2021   Gastroesophageal reflux 01/25/2021   Tobacco dependence 01/25/2021   Left hip pain 11/24/2020   Chronic bilateral low back pain with left-sided sciatica 11/24/2020   Bilateral hand numbness 10/29/2014   Onychomycosis of toenail 10/29/2014   HTN (hypertension) 10/29/2014   Retinitis pigmentosa    Past Medical History:  Diagnosis Date   Anxiety    Arthritis    hands   Asthma    Cataract    GERD (gastroesophageal reflux disease)    History of concussion    per pt concussion without LOC 1980s -- no residuals   History of seizure    none in 30 years ago- none since then   Hypertension    Retinitis pigmentosa congenital    Seizures ([place])    Spermatocele    left   Spermatocele of epididymis, unspecified 04/17/2018   Wears glasses     Family History  Problem Relation Age of Onset   Hypertension Mother    Colon cancer Neg Hx    Esophageal cancer Neg Hx  Rectal cancer Neg Hx    Stomach cancer Neg Hx     Past Surgical History:  Procedure Laterality Date   COLONOSCOPY     greater than 30 years   INGUINAL HERNIA REPAIR Left infant;  age 89;  2s   SPERMATOCELECTOMY Left 06/26/2018   Procedure: SPERMATOCELECTOMY;  Surgeon: Ceasar Mons, MD;  Location: [facility];  Service: Urology;  Laterality: Left;   TOTAL HIP ARTHROPLASTY Left 05/03/2021   Procedure: LEFT TOTAL HIP ARTHROPLASTY ANTERIOR APPROACH;  Surgeon: Leandrew Koyanagi, MD;  Location: [facility];  Service: Orthopedics;  Laterality: Left;  3-C   Social History   Occupational History   Occupation: Carpentry    Occupation: Ambulance person   Tobacco Use   Smoking status: Some Days    Packs/day: 0.50    Years: 20.00    Pack years: 10.00    Types: Cigarettes   Smokeless tobacco: Never   Tobacco comments:    recently stopped smoking approx. 06-14-2018  Vaping Use   Vaping  Use: Never used  Substance and Sexual Activity   Alcohol use: Yes    Comment: pt states he drinks "about a fifth evry week"   Drug use: Yes    Types: Marijuana    Comment: twice a week, if that, per pt   Sexual activity: Not on file

## 2021-07-08 ENCOUNTER — Other Ambulatory Visit: Payer: Self-pay

## 2021-09-15 ENCOUNTER — Other Ambulatory Visit: Payer: Self-pay

## 2021-11-25 ENCOUNTER — Other Ambulatory Visit: Payer: Self-pay | Admitting: Physician Assistant

## 2021-11-25 ENCOUNTER — Other Ambulatory Visit: Payer: Self-pay

## 2021-11-25 ENCOUNTER — Other Ambulatory Visit: Payer: Self-pay | Admitting: Critical Care Medicine

## 2021-11-25 MED ORDER — AMLODIPINE BESYLATE 10 MG PO TABS: 10.0000 mg | ORAL_TABLET | Freq: Every day | ORAL | 1 refills | Status: DC

## 2021-11-25 MED ORDER — CHLORTHALIDONE 25 MG PO TABS: 25.0000 mg | ORAL_TABLET | Freq: Every day | ORAL | 1 refills | Status: DC

## 2021-11-25 NOTE — Telephone Encounter (Signed)
Medication Refill - Medication: amLODipine (NORVASC) 10 MG tablet, chlorthalidone (HYGROTON) 25 MG tablet, chlorthalidone (HYGROTON) 25 MG tablet   (Patient wanted PCP to refill medication to hold her over until 02/01/2022)  Has the patient contacted their pharmacy? Yes.    (Agent: If yes, when and what did the pharmacy advise?)  Preferred Pharmacy (with phone number or street name):   [id number] [address] Phone:  [phone] Fax:  [phone]      Has the patient been seen for an appointment in the last year OR does the patient have an upcoming appointment? Yes.    Agent: Please be advised that RX refills may take up to 3 business days. We ask that you follow-up with your pharmacy. [id number] [address] Phone:  [phone]:  [phone]

## 2021-11-25 NOTE — Telephone Encounter (Signed)
Requested medication (s) are due for refill today- expired Rx  Requested medication (s) are on the active medication list -yes  Future visit scheduled -yes  Last refill: 11/24/20 #90 1RF  Notes to clinic: expired Rx  Requested Prescriptions  Pending Prescriptions Disp Refills   amLODipine (NORVASC) 10 MG tablet 90 tablet 1    Sig: Take 1 tablet (10 mg total) by mouth daily. to lower blood pressure     Cardiovascular: Calcium Channel Blockers 2 Failed - 11/25/2021  1:45 PM      Failed - Last BP in normal range    BP Readings from Last 1 Encounters:  05/04/21 (!) 138/95         Failed - Valid encounter within last 6 months    Recent Outpatient Visits           10 months ago Primary hypertension   [place] Darren Stain, MD   1 year ago Chronic bilateral low back pain with left-sided sciatica   [place], Patrick E, MD   1 year ago Essential hypertension   [place], Dionne Bucy, [place]   1 year ago Essential hypertension   [place], Stephen L, RPH-CPP   1 year ago Essential hypertension   [place], Stephen L, RPH-CPP       Future Appointments             In 2 months Darren Stain, MD [place] - Last Heart Rate in normal range    Pulse Readings from Last 1 Encounters:  05/04/21 88          chlorthalidone (HYGROTON) 25 MG tablet 90 tablet 1    Sig: Take 1 tablet (25 mg total) by mouth daily.     Cardiovascular: Diuretics - Thiazide Failed - 11/25/2021  1:45 PM      Failed - Cr in normal range and within 180 days    Creat  Date Value Ref Range Status  10/29/2014 0.90 0.50 - 1.35 mg/dL Final   Creatinine, Ser  Date Value Ref Range Status  05/04/2021 0.91 0.61 - 1.24 mg/dL Final         Failed - K in  normal range and within 180 days    Potassium  Date Value Ref Range Status  05/04/2021 4.0 3.5 - 5.1 mmol/L Final         Failed - Na in normal range and within 180 days    Sodium  Date Value Ref Range Status  05/04/2021 133 (L) 135 - 145 mmol/L Final  11/18/2019 145 (H) 134 - 144 mmol/L Final         Failed - Last BP in normal range    BP Readings from Last 1 Encounters:  05/04/21 (!) 138/95         Failed - Valid encounter within last 6 months    Recent Outpatient Visits           10 months ago Primary hypertension   [place] Darren Stain, MD   1 year ago Chronic bilateral low back pain with left-sided sciatica   [place], Patrick E, MD   1 year ago Essential hypertension  [place], Dionne Bucy, [place]   1 year ago Essential hypertension   [place], Jarome [place], RPH-CPP   1 year ago Essential hypertension   [place], Stephen L, RPH-CPP       Future Appointments             In 2 months Darren Stain, MD [place]                Requested Prescriptions  Pending Prescriptions Disp Refills   amLODipine (NORVASC) 10 MG tablet 90 tablet 1    Sig: Take 1 tablet (10 mg total) by mouth daily. to lower blood pressure     Cardiovascular: Calcium Channel Blockers 2 Failed - 11/25/2021  1:45 PM      Failed - Last BP in normal range    BP Readings from Last 1 Encounters:  05/04/21 (!) 138/95         Failed - Valid encounter within last 6 months    Recent Outpatient Visits           10 months ago Primary hypertension   [place] Darren Stain, MD   1 year ago Chronic bilateral low back pain with left-sided sciatica   [place], Patrick E, MD   1 year ago  Essential hypertension   [place], Dionne Bucy, [place]   1 year ago Essential hypertension   [place], Stephen L, RPH-CPP   1 year ago Essential hypertension   [place], Stephen L, RPH-CPP       Future Appointments             In 2 months Darren Stain, MD [place] - Last Heart Rate in normal range    Pulse Readings from Last 1 Encounters:  05/04/21 88          chlorthalidone (HYGROTON) 25 MG tablet 90 tablet 1    Sig: Take 1 tablet (25 mg total) by mouth daily.     Cardiovascular: Diuretics - Thiazide Failed - 11/25/2021  1:45 PM      Failed - Cr in normal range and within 180 days    Creat  Date Value Ref Range Status  10/29/2014 0.90 0.50 - 1.35 mg/dL Final   Creatinine, Ser  Date Value Ref Range Status  05/04/2021 0.91 0.61 - 1.24 mg/dL Final         Failed - K in normal range and within 180 days    Potassium  Date Value Ref Range Status  05/04/2021 4.0 3.5 - 5.1 mmol/L Final         Failed - Na in normal range and within 180 days    Sodium  Date Value Ref Range Status  05/04/2021 133 (L) 135 - 145 mmol/L Final  11/18/2019 145 (H) 134 - 144 mmol/L Final         Failed - Last BP in normal range    BP Readings from Last 1 Encounters:  05/04/21 (!) 138/95         Failed - Valid encounter within last 6 months    Recent Outpatient Visits  10 months ago Primary hypertension   [place] Darren Stain, MD   1 year ago Chronic bilateral low back pain with left-sided sciatica   [place], Patrick E, MD   1 year ago Essential hypertension   [place], Dionne Bucy, [place]   1 year ago Essential hypertension   [place], RPH-CPP   1 year ago Essential hypertension   [place], RPH-CPP       Future Appointments             In 2 months Joya Gaskins Burnett Harry, MD [place]

## 2022-01-21 ENCOUNTER — Other Ambulatory Visit: Payer: Self-pay

## 2022-02-01 ENCOUNTER — Ambulatory Visit: Payer: Medicaid Other | Admitting: Critical Care Medicine

## 2022-02-01 ENCOUNTER — Other Ambulatory Visit: Payer: Self-pay | Admitting: Critical Care Medicine

## 2022-02-01 NOTE — Telephone Encounter (Unsigned)
Medication Refill - Medication:  amLODipine (NORVASC) 10 MG tablet  gabapentin (NEURONTIN) 300 MG capsule  Has the patient contacted their pharmacy? No.  Preferred Pharmacy (with phone number or street name):  [id number] [address] Phone:  [phone] Fax:  [phone]    Has the patient been seen for an appointment in the last year OR does the patient have an upcoming appointment? No. Pt stated he is currently out of medications

## 2022-02-02 NOTE — Telephone Encounter (Signed)
Last visit was 11/2020. Per protocol, will defer to PCP.

## 2022-02-02 NOTE — Telephone Encounter (Signed)
Amlodipine prescription ended 11/25/21  Gabapentin prescription ended 11/25/21  Both prescriptions ended. Note attached no refills until seen in office   Requested Prescriptions  Pending Prescriptions Disp Refills   amLODipine (NORVASC) 10 MG tablet 30 tablet 1    Sig: Take 1 tablet (10 mg total) by mouth daily. to lower blood pressure     Cardiovascular: Calcium Channel Blockers 2 Failed - 02/01/2022  3:26 PM      Failed - Last BP in normal range    BP Readings from Last 1 Encounters:  05/04/21 (!) 138/95         Failed - Valid encounter within last 6 months    Recent Outpatient Visits           1 year ago Primary hypertension   [place] Elsie Stain, MD   1 year ago Chronic bilateral low back pain with left-sided sciatica   [place], Patrick E, MD   1 year ago Essential hypertension   [place], Dionne Bucy, [place]   2 years ago Essential hypertension   [place], Stephen L, RPH-CPP   2 years ago Essential hypertension   [place], Stephen L, RPH-CPP       Future Appointments             In 1 month Elsie Stain, MD [place] - Last Heart Rate in normal range    Pulse Readings from Last 1 Encounters:  05/04/21 88          gabapentin (NEURONTIN) 300 MG capsule 180 capsule 5    Sig: TAKE 2 CAPSULES (600 MG TOTAL) BY MOUTH 3 (THREE) TIMES DAILY. FOR NERVE PAIN     Neurology: Anticonvulsants - gabapentin Failed - 02/01/2022  3:26 PM      Failed - Completed PHQ-2 or PHQ-9 in the last 360 days      Failed - Valid encounter within last 12 months    Recent Outpatient Visits           1 year ago Primary hypertension   [place] Elsie Stain, MD   1 year ago Chronic bilateral low  back pain with left-sided sciatica   [place], Patrick E, MD   1 year ago Essential hypertension   [place], Dionne Bucy, [place]   2 years ago Essential hypertension   [place], Stephen L, RPH-CPP   2 years ago Essential hypertension   [place], [place], RPH-CPP       Future Appointments             In 1 month Elsie Stain, MD [place] - Cr in normal range and within 360 days    Creat  Date Value Ref Range Status  10/29/2014 0.90 0.50 - 1.35 mg/dL Final   Creatinine, Ser  Date Value Ref Range Status  05/04/2021 0.91 0.61 - 1.24 mg/dL Final

## 2022-02-03 MED ORDER — AMLODIPINE BESYLATE 10 MG PO TABS: 10.0000 mg | ORAL_TABLET | Freq: Every day | ORAL | 1 refills | Status: DC

## 2022-02-03 MED ORDER — GABAPENTIN 300 MG PO CAPS: ORAL_CAPSULE | ORAL | 5 refills | Status: DC

## 2022-03-31 ENCOUNTER — Ambulatory Visit: Payer: Self-pay | Admitting: Critical Care Medicine

## 2022-04-21 ENCOUNTER — Encounter: Payer: Self-pay | Admitting: Critical Care Medicine

## 2022-04-21 ENCOUNTER — Ambulatory Visit: Payer: Medicare Other | Attending: Critical Care Medicine | Admitting: Critical Care Medicine

## 2022-04-21 ENCOUNTER — Ambulatory Visit: Payer: Self-pay | Admitting: Physician Assistant

## 2022-04-21 ENCOUNTER — Ambulatory Visit: Payer: Self-pay

## 2022-04-21 VITALS — BP 148/89 | HR 72 | Ht 69.0 in | Wt 212.0 lb

## 2022-04-21 DIAGNOSIS — F17200 Nicotine dependence, unspecified, uncomplicated: Secondary | ICD-10-CM | POA: Diagnosis not present

## 2022-04-21 DIAGNOSIS — K219 Gastro-esophageal reflux disease without esophagitis: Secondary | ICD-10-CM

## 2022-04-21 DIAGNOSIS — M5442 Lumbago with sciatica, left side: Secondary | ICD-10-CM

## 2022-04-21 DIAGNOSIS — G894 Chronic pain syndrome: Secondary | ICD-10-CM | POA: Diagnosis not present

## 2022-04-21 DIAGNOSIS — M21511 Acquired clawhand, right hand: Secondary | ICD-10-CM | POA: Insufficient documentation

## 2022-04-21 DIAGNOSIS — I10 Essential (primary) hypertension: Secondary | ICD-10-CM | POA: Diagnosis not present

## 2022-04-21 DIAGNOSIS — Z1159 Encounter for screening for other viral diseases: Secondary | ICD-10-CM

## 2022-04-21 DIAGNOSIS — G8929 Other chronic pain: Secondary | ICD-10-CM | POA: Diagnosis not present

## 2022-04-21 MED ORDER — TRAMADOL HCL 50 MG PO TABS: 50.0000 mg | ORAL_TABLET | Freq: Three times a day (TID) | ORAL | 0 refills | Status: DC | PRN

## 2022-04-21 MED ORDER — METHOCARBAMOL 500 MG PO TABS: 500.0000 mg | ORAL_TABLET | Freq: Two times a day (BID) | ORAL | 2 refills | Status: DC | PRN

## 2022-04-21 MED ORDER — VALSARTAN 160 MG PO TABS: 160.0000 mg | ORAL_TABLET | Freq: Every day | ORAL | 3 refills | Status: DC

## 2022-04-21 MED ORDER — FEXOFENADINE HCL 180 MG PO TABS: 180.0000 mg | ORAL_TABLET | Freq: Every day | ORAL | 2 refills | Status: DC | PRN

## 2022-04-21 MED ORDER — CHLORTHALIDONE 25 MG PO TABS: 25.0000 mg | ORAL_TABLET | Freq: Every day | ORAL | 1 refills | Status: DC

## 2022-04-21 MED ORDER — AMLODIPINE BESYLATE 10 MG PO TABS: 10.0000 mg | ORAL_TABLET | Freq: Every day | ORAL | 1 refills | Status: DC

## 2022-04-21 MED ORDER — DICLOFENAC SODIUM 1 % EX GEL: 2.0000 g | Freq: Four times a day (QID) | CUTANEOUS | 3 refills | Status: DC | PRN

## 2022-04-21 NOTE — Assessment & Plan Note (Signed)
Give short-term dose of tramadol as needed renew muscle relaxants renew topical Voltaren gel and refer to orthopedics and chronic pain management in [facility]

## 2022-04-21 NOTE — Assessment & Plan Note (Signed)
Hypertension currently not controlled plan to begin chlorthalidone daily continue amlodipine and add valsartan daily and check labs

## 2022-04-21 NOTE — Progress Notes (Addendum)
Established Patient Office Visit  Subjective   Patient ID: Darren Larsen, male    DOB: 1966/04/08  Age: 56 y.o. MRN: [id number]  Chief Complaint  Patient presents with   Medication Refill   Medication Reaction    Reaction to gabapentin     Not seen since 01/2021 HTN, LBP   s/p THR Left hip 05/2021  This a 56 year old male seen in return follow-up not seen July 2022.  He has had a left total hip replacement but now has low back pain radiating into the right groin.  He states gabapentin had caused side effects.  He now lives in [place] has housing.  On arrival blood pressure 148/89.  He has not been compliant with some of his medications.  He is looking for pain management.  He drinks 1 beer occasionally.  Smokes about 1 cigarette every other day.    Patient Active Problem List   Diagnosis Date Noted   Claw hand of right upper extremity 04/21/2022   Chronic pain syndrome 04/21/2022   Status post total replacement of left hip 05/03/2021   Gastroesophageal reflux 01/25/2021   Tobacco dependence 01/25/2021   Chronic bilateral low back pain with left-sided sciatica 11/24/2020   Bilateral hand numbness 10/29/2014   Onychomycosis of toenail 10/29/2014   HTN (hypertension) 10/29/2014   Retinitis pigmentosa    Past Medical History:  Diagnosis Date   Anxiety    Arthritis    hands   Asthma    Cataract    GERD (gastroesophageal reflux disease)    History of concussion    per pt concussion without LOC 1980s -- no residuals   History of seizure    none in 30 years ago- none since then   Hypertension    Retinitis pigmentosa congenital    Seizures ([place])    Spermatocele    left   Spermatocele of epididymis, unspecified 04/17/2018   Wears glasses    Past Surgical History:  Procedure Laterality Date   COLONOSCOPY     greater than 30 years   INGUINAL HERNIA REPAIR Left infant;  age 11;  18s   SPERMATOCELECTOMY Left 06/26/2018   Procedure: SPERMATOCELECTOMY;  Surgeon:  Ceasar Mons, MD;  Location: [facility];  Service: Urology;  Laterality: Left;   TOTAL HIP ARTHROPLASTY Left 05/03/2021   Procedure: LEFT TOTAL HIP ARTHROPLASTY ANTERIOR APPROACH;  Surgeon: Leandrew Koyanagi, MD;  Location: [facility];  Service: Orthopedics;  Laterality: Left;  3-C   Social History   Tobacco Use   Smoking status: Some Days    Packs/day: 0.50    Years: 20.00    Total pack years: 10.00    Types: Cigarettes   Smokeless tobacco: Never   Tobacco comments:    recently stopped smoking approx. 06-14-2018  Vaping Use   Vaping Use: Never used  Substance Use Topics   Alcohol use: Yes    Comment: pt states he drinks "about a fifth evry week"   Drug use: Yes    Types: Marijuana    Comment: twice a week, if that, per pt   Social History   Socioeconomic History   Marital status: Legally Separated    Spouse name: Not on file   Number of children: 5   Years of education: 12    Highest education level: Not on file  Occupational History   Occupation: Carpentry    Occupation: Ambulance person   Tobacco Use   Smoking status: Some Days    Packs/day: 0.50  Years: 20.00    Total pack years: 10.00    Types: Cigarettes   Smokeless tobacco: Never   Tobacco comments:    recently stopped smoking approx. 06-14-2018  Vaping Use   Vaping Use: Never used  Substance and Sexual Activity   Alcohol use: Yes    Comment: pt states he drinks "about a fifth evry week"   Drug use: Yes    Types: Marijuana    Comment: twice a week, if that, per pt   Sexual activity: Not on file  Other Topics Concern   Not on file  Social History Narrative   Live with niece   Children grown and gone.   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    Social Determinants of Health   Financial Resource Strain: Not on file  Food Insecurity: Not on file  Transportation Needs: Not on file  Physical Activity: Not on file  Stress: Not on file   Social Connections: Not on file  Intimate Partner Violence: Not on file   Family Status  Relation Name Status   Mother  Deceased   Father  Deceased   Neg Hx  (Not Specified)   Family History  Problem Relation Age of Onset   Hypertension Mother    Colon cancer Neg Hx    Esophageal cancer Neg Hx    Rectal cancer Neg Hx    Stomach cancer Neg Hx    Allergies  Allergen Reactions   Gabapentin Itching    Review of Systems  Constitutional:  Negative for chills, diaphoresis, fever, malaise/fatigue and weight loss.  HENT:  Negative for congestion, hearing loss, nosebleeds, sore throat and tinnitus.   Eyes:  Negative for blurred vision, photophobia and redness.  Respiratory:  Negative for cough, hemoptysis, sputum production, shortness of breath, wheezing and stridor.   Cardiovascular:  Negative for chest pain, palpitations, orthopnea, claudication, leg swelling and PND.  Gastrointestinal:  Negative for abdominal pain, blood in stool, constipation, diarrhea, heartburn, nausea and vomiting.  Genitourinary:  Negative for dysuria, flank pain, frequency, hematuria and urgency.  Musculoskeletal:  Positive for back pain and neck pain. Negative for falls, joint pain and myalgias.  Skin:  Negative for itching and rash.  Neurological:  Negative for dizziness, tingling, tremors, sensory change, speech change, focal weakness, seizures, loss of consciousness, weakness and headaches.  Endo/Heme/Allergies:  Negative for environmental allergies and polydipsia. Does not bruise/bleed easily.  Psychiatric/Behavioral:  Negative for depression, memory loss, substance abuse and suicidal ideas. The patient is not nervous/anxious and does not have insomnia.       Objective:     BP (!) 148/89   Pulse 72   Ht _0  (1.753 m)   Wt 212 lb (96.2 kg)   SpO2 93%   BMI 31.31 kg/m  BP Readings from Last 3 Encounters:  04/21/22 (!) 148/89  05/04/21 (!) 138/95  04/22/21 120/88   Wt Readings from Last 3  Encounters:  04/21/22 212 lb (96.2 kg)  05/03/21 234 lb (106.1 kg)  04/22/21 234 lb 11.2 oz (106.5 kg)      Physical Exam Vitals reviewed.  Constitutional:      Appearance: Normal appearance. He is well-developed. He is not diaphoretic.  HENT:     Head: Normocephalic and atraumatic.     Nose: No nasal deformity, septal deviation, mucosal edema or rhinorrhea.     Right Sinus: No maxillary sinus tenderness or  frontal sinus tenderness.     Left Sinus: No maxillary sinus tenderness or frontal sinus tenderness.     Mouth/Throat:     Pharynx: No oropharyngeal exudate.  Eyes:     General: No scleral icterus.    Conjunctiva/sclera: Conjunctivae normal.     Pupils: Pupils are equal, round, and reactive to light.  Neck:     Thyroid: No thyromegaly.     Vascular: No carotid bruit or JVD.     Trachea: Trachea normal. No tracheal tenderness or tracheal deviation.  Cardiovascular:     Rate and Rhythm: Normal rate and regular rhythm.     Chest Wall: PMI is not displaced.     Pulses: Normal pulses. No decreased pulses.     Heart sounds: Normal heart sounds, S1 normal and S2 normal. Heart sounds not distant. No murmur heard.    No systolic murmur is present.     No diastolic murmur is present.     No friction rub. No gallop. No S3 or S4 sounds.  Pulmonary:     Effort: No tachypnea, accessory muscle usage or respiratory distress.     Breath sounds: No stridor. No decreased breath sounds, wheezing, rhonchi or rales.  Chest:     Chest wall: No tenderness.  Abdominal:     General: Bowel sounds are normal. There is no distension.     Palpations: Abdomen is soft. Abdomen is not rigid.     Tenderness: There is no abdominal tenderness. There is no guarding or rebound.  Genitourinary:    Penis: Normal.      Testes: Normal.  Musculoskeletal:        General: Tenderness and deformity present. Normal range of motion.     Cervical back: Normal range of motion and neck supple. No edema, erythema or  rigidity. No muscular tenderness. Normal range of motion.     Comments: Clawhand deformity right hand Bilateral lumbar paraspinal tenderness  Lymphadenopathy:     Head:     Right side of head: No submental or submandibular adenopathy.     Left side of head: No submental or submandibular adenopathy.     Cervical: No cervical adenopathy.  Skin:    General: Skin is warm and dry.     Coloration: Skin is not pale.     Findings: No rash.     Nails: There is no clubbing.  Neurological:     Mental Status: He is alert and oriented to person, place, and time.     Sensory: No sensory deficit.  Psychiatric:        Speech: Speech normal.        Behavior: Behavior normal.      Results for orders placed or performed in visit on 04/21/22  [id number] 11+Oxyco+Alc+Crt-Bund  Result Value Ref Range   Ethanol Negative Cutoff=0.020 %   Amphetamines, Urine Negative Cutoff=1000 ng/mL   Barbiturate Negative Cutoff=200 ng/mL   BENZODIAZ UR QL Negative Cutoff=200 ng/mL   Cannabinoid Quant, Ur Negative Cutoff=50 ng/mL   Cocaine (Metabolite) Negative Cutoff=300 ng/mL   OPIATE SCREEN URINE Negative Cutoff=300 ng/mL   Oxycodone/Oxymorphone, Urine Negative Cutoff=300 ng/mL   Phencyclidine Negative Cutoff=25 ng/mL   Methadone Screen, Urine Negative Cutoff=300 ng/mL   Propoxyphene Negative Cutoff=300 ng/mL   Meperidine Negative Cutoff=200 ng/mL   Tramadol Negative Cutoff=200 ng/mL   Creatinine 107.3 20.0 - 300.0 mg/dL   pH, Urine 5.5 4.5 - 8.9  CBC with Differential/Platelet  Result Value Ref Range   WBC 8.5 3.4 -  10.8 x10E3/uL   RBC 5.54 4.14 - 5.80 x10E6/uL   Hemoglobin 12.9 (L) 13.0 - 17.7 g/dL   Hematocrit 41.1 37.5 - 51.0 %   MCV 74 (L) 79 - 97 fL   MCH 23.3 (L) 26.6 - 33.0 pg   MCHC 31.4 (L) 31.5 - 35.7 g/dL   RDW 16.8 (H) 11.6 - 15.4 %   Platelets 192 150 - 450 x10E3/uL   Neutrophils 44 Not Estab. %   Lymphs 37 Not Estab. %   Monocytes 13 Not Estab. %   Eos 5 Not Estab. %   Basos 1 Not  Estab. %   Neutrophils Absolute 3.8 1.4 - 7.0 x10E3/uL   Lymphocytes Absolute 3.2 (H) 0.7 - 3.1 x10E3/uL   Monocytes Absolute 1.1 (H) 0.1 - 0.9 x10E3/uL   EOS (ABSOLUTE) 0.4 0.0 - 0.4 x10E3/uL   Basophils Absolute 0.1 0.0 - 0.2 x10E3/uL   Immature Granulocytes 0 Not Estab. %   Immature Grans (Abs) 0.0 0.0 - 0.1 x10E3/uL  Comprehensive metabolic panel  Result Value Ref Range   Glucose 94 70 - 99 mg/dL   BUN 14 6 - 24 mg/dL   Creatinine, Ser 1.03 0.76 - 1.27 mg/dL   eGFR 85 >59 mL/min/1.73   BUN/Creatinine Ratio 14 9 - 20   Sodium 143 134 - 144 mmol/L   Potassium 4.5 3.5 - 5.2 mmol/L   Chloride 106 96 - 106 mmol/L   CO2 22 20 - 29 mmol/L   Calcium 10.0 8.7 - 10.2 mg/dL   Total Protein 6.8 6.0 - 8.5 g/dL   Albumin 4.4 3.8 - 4.9 g/dL   Globulin, Total 2.4 1.5 - 4.5 g/dL   Albumin/Globulin Ratio 1.8 1.2 - 2.2   Bilirubin Total 0.2 0.0 - 1.2 mg/dL   Alkaline Phosphatase 108 44 - 121 IU/L   AST 17 0 - 40 IU/L   ALT 13 0 - 44 IU/L  Lipid panel  Result Value Ref Range   Cholesterol, Total 130 100 - 199 mg/dL   Triglycerides 72 0 - 149 mg/dL   HDL 47 >39 mg/dL   VLDL Cholesterol Cal 15 5 - 40 mg/dL   LDL Chol Calc (NIH) 68 0 - 99 mg/dL   Chol/HDL Ratio 2.8 0.0 - 5.0 ratio  HCV Ab w Reflex to Quant PCR  Result Value Ref Range   HCV Ab Non Reactive Non Reactive  Interpretation:  Result Value Ref Range   HCV Interp 1: Comment     Last CBC Lab Results  Component Value Date   WBC 8.5 04/21/2022   HGB 12.9 (L) 04/21/2022   HCT 41.1 04/21/2022   MCV 74 (L) 04/21/2022   MCH 23.3 (L) 04/21/2022   RDW 16.8 (H) 04/21/2022   PLT 192 62/83/1517   Last metabolic panel Lab Results  Component Value Date   GLUCOSE 94 04/21/2022   NA 143 04/21/2022   K 4.5 04/21/2022   CL 106 04/21/2022   CO2 22 04/21/2022   BUN 14 04/21/2022   CREATININE 1.03 04/21/2022   GFRNONAA >60 05/04/2021   CALCIUM 10.0 04/21/2022   PROT 6.8 04/21/2022   ALBUMIN 4.4 04/21/2022   LABGLOB 2.4 04/21/2022    AGRATIO 1.8 04/21/2022   BILITOT 0.2 04/21/2022   ALKPHOS 108 04/21/2022   AST 17 04/21/2022   ALT 13 04/21/2022   ANIONGAP 7 05/04/2021   Last lipids Lab Results  Component Value Date   CHOL 130 04/21/2022   HDL 47 04/21/2022   LDLCALC 68  04/21/2022   TRIG 72 04/21/2022   CHOLHDL 2.8 04/21/2022      The 10-year ASCVD risk score (Arnett DK, et al., 2019) is: 22.9%    Assessment & Plan:   Problem List Items Addressed This Visit       Cardiovascular and Mediastinum   HTN (hypertension) - Primary (Chronic)    Hypertension currently not controlled plan to begin chlorthalidone daily continue amlodipine and add valsartan daily and check labs      Relevant Medications   amLODipine (NORVASC) 10 MG tablet   valsartan (DIOVAN) 160 MG tablet   Other Relevant Orders   CBC with Differential/Platelet (Completed)   Comprehensive metabolic panel (Completed)   Lipid panel (Completed)     Digestive   Gastroesophageal reflux   Relevant Orders   CBC with Differential/Platelet (Completed)     Nervous and Auditory   Chronic bilateral low back pain with left-sided sciatica    Give short-term dose of tramadol as needed renew muscle relaxants renew topical Voltaren gel and refer to orthopedics and chronic pain management in [place]      Relevant Medications   methocarbamol (ROBAXIN) 500 MG tablet   Other Relevant Orders   [id number] 11+Oxyco+Alc+Crt-Bund (Completed)   Ambulatory referral to Orthopedic Surgery   Ambulatory referral to Pain Clinic     Musculoskeletal and Integument   Claw hand of right upper extremity    Referral to orthopedics made      Relevant Orders   Ambulatory referral to Orthopedic Surgery     Other   Tobacco dependence    Patient is nearly completed tobacco use      Chronic pain syndrome   Relevant Medications   methocarbamol (ROBAXIN) 500 MG tablet   Other Relevant Orders   [id number] 11+Oxyco+Alc+Crt-Bund (Completed)   Ambulatory referral to  Pain Clinic   Other Visit Diagnoses     Need for hepatitis C screening test       Relevant Orders   HCV Ab w Reflex to Quant PCR (Completed)     38 minutes spent extra time needed for patient education and review of multiple systems multiple orders high complexity  Return in about 2 months (around 06/21/2022) for htn, chronic pain.    Asencion Noble, MD

## 2022-04-21 NOTE — Patient Instructions (Signed)
Begin tramadol as needed for pain urine drug screen will be obtained  Referral to pain management is made  Referral to orthopedic surgery made  Refill was on all medications produced sent to [facility]  Complete set of screening labs obtained  Start valsartan 1 daily for blood pressure addition of the other 2 blood pressure medications  Return Dr. Joya Gaskins 2 months

## 2022-04-21 NOTE — Assessment & Plan Note (Addendum)
Patient is nearly completed tobacco use

## 2022-04-21 NOTE — Assessment & Plan Note (Signed)
Referral to orthopedics made

## 2022-04-22 ENCOUNTER — Telehealth: Payer: Self-pay

## 2022-04-22 ENCOUNTER — Ambulatory Visit: Payer: Self-pay

## 2022-04-22 LAB — LIPID PANEL
Chol/HDL Ratio: 2.8 ratio (ref 0.0–5.0)
Cholesterol, Total: 130 mg/dL (ref 100–199)
HDL: 47 mg/dL (ref >39)
LDL Chol Calc (NIH): 68 mg/dL (ref 0–99)
Triglycerides: 72 mg/dL (ref 0–149)
VLDL Cholesterol Cal: 15 mg/dL (ref 5–40)

## 2022-04-22 LAB — CBC WITH DIFFERENTIAL/PLATELET
Basophils Absolute: 0.1 10*3/uL (ref 0.0–0.2)
Basos: 1 %
EOS (ABSOLUTE): 0.4 10*3/uL (ref 0.0–0.4)
Eos: 5 %
Hematocrit: 41.1 % (ref 37.5–51.0)
Hemoglobin: 12.9 g/dL — ABNORMAL LOW (ref 13.0–17.7)
Immature Grans (Abs): 0 10*3/uL (ref 0.0–0.1)
Immature Granulocytes: 0 %
Lymphocytes Absolute: 3.2 10*3/uL — ABNORMAL HIGH (ref 0.7–3.1)
Lymphs: 37 %
MCH: 23.3 pg — ABNORMAL LOW (ref 26.6–33.0)
MCHC: 31.4 g/dL — ABNORMAL LOW (ref 31.5–35.7)
MCV: 74 fL — ABNORMAL LOW (ref 79–97)
Monocytes Absolute: 1.1 10*3/uL — ABNORMAL HIGH (ref 0.1–0.9)
Monocytes: 13 %
Neutrophils Absolute: 3.8 10*3/uL (ref 1.4–7.0)
Neutrophils: 44 %
Platelets: 192 10*3/uL (ref 150–450)
RBC: 5.54 x10E6/uL (ref 4.14–5.80)
RDW: 16.8 % — ABNORMAL HIGH (ref 11.6–15.4)
WBC: 8.5 10*3/uL (ref 3.4–10.8)

## 2022-04-22 LAB — COMPREHENSIVE METABOLIC PANEL
ALT: 13 IU/L (ref 0–44)
AST: 17 IU/L (ref 0–40)
Albumin/Globulin Ratio: 1.8 (ref 1.2–2.2)
Albumin: 4.4 g/dL (ref 3.8–4.9)
Alkaline Phosphatase: 108 IU/L (ref 44–121)
BUN/Creatinine Ratio: 14 (ref 9–20)
BUN: 14 mg/dL (ref 6–24)
Bilirubin Total: 0.2 mg/dL (ref 0.0–1.2)
CO2: 22 mmol/L (ref 20–29)
Calcium: 10 mg/dL (ref 8.7–10.2)
Chloride: 106 mmol/L (ref 96–106)
Creatinine, Ser: 1.03 mg/dL (ref 0.76–1.27)
Globulin, Total: 2.4 g/dL (ref 1.5–4.5)
Glucose: 94 mg/dL (ref 70–99)
Potassium: 4.5 mmol/L (ref 3.5–5.2)
Sodium: 143 mmol/L (ref 134–144)
Total Protein: 6.8 g/dL (ref 6.0–8.5)
eGFR: 85 mL/min/{1.73_m2} (ref >59)

## 2022-04-22 LAB — DRUG SCREEN 764883 11+OXYCO+ALC+CRT-BUND
Amphetamines, Urine: NEGATIVE ng/mL
BENZODIAZ UR QL: NEGATIVE ng/mL
Barbiturate: NEGATIVE ng/mL
Cannabinoid Quant, Ur: NEGATIVE ng/mL
Cocaine (Metabolite): NEGATIVE ng/mL
Creatinine: 107.3 mg/dL (ref 20.0–300.0)
Ethanol: NEGATIVE %
Meperidine: NEGATIVE ng/mL
Methadone Screen, Urine: NEGATIVE ng/mL
OPIATE SCREEN URINE: NEGATIVE ng/mL
Oxycodone/Oxymorphone, Urine: NEGATIVE ng/mL
Phencyclidine: NEGATIVE ng/mL
Propoxyphene: NEGATIVE ng/mL
Tramadol: NEGATIVE ng/mL
pH, Urine: 5.5 (ref 4.5–8.9)

## 2022-04-22 LAB — HCV AB W REFLEX TO QUANT PCR: HCV Ab: NONREACTIVE

## 2022-04-22 LAB — HCV INTERPRETATION

## 2022-04-22 NOTE — Telephone Encounter (Signed)
-----   Message from Elsie Stain, MD sent at 04/22/2022  7:47 AM EDT ----- Let pt know blood count stable, liver kidney normal, cholesterol normal, hep c neg

## 2022-04-22 NOTE — Telephone Encounter (Signed)
Pt was called and vm was left, Information has been sent to nurse pool.   

## 2022-04-22 NOTE — Progress Notes (Signed)
Let pt know blood count stable, liver kidney normal, cholesterol normal, hep c neg

## 2022-04-23 NOTE — Progress Notes (Signed)
Let pt know dryg screen neg so he is in compliance with pain management

## 2022-04-25 ENCOUNTER — Telehealth: Payer: Self-pay

## 2022-04-25 NOTE — Telephone Encounter (Signed)
-----   Message from Elsie Stain, MD sent at 04/23/2022  7:20 AM EDT ----- Let pt know dryg screen neg so he is in compliance with pain management

## 2022-04-25 NOTE — Telephone Encounter (Signed)
Pt was called and vm was left, Information has been sent to nurse pool.   

## 2022-05-13 ENCOUNTER — Other Ambulatory Visit: Payer: Self-pay | Admitting: Critical Care Medicine

## 2022-05-13 DIAGNOSIS — M21512 Acquired clawhand, left hand: Secondary | ICD-10-CM | POA: Diagnosis not present

## 2022-05-13 DIAGNOSIS — M21511 Acquired clawhand, right hand: Secondary | ICD-10-CM | POA: Diagnosis not present

## 2022-05-13 DIAGNOSIS — S62616P Displaced fracture of proximal phalanx of right little finger, subsequent encounter for fracture with malunion: Secondary | ICD-10-CM | POA: Diagnosis not present

## 2022-05-13 DIAGNOSIS — S62614P Displaced fracture of proximal phalanx of right ring finger, subsequent encounter for fracture with malunion: Secondary | ICD-10-CM | POA: Diagnosis not present

## 2022-05-13 DIAGNOSIS — M25641 Stiffness of right hand, not elsewhere classified: Secondary | ICD-10-CM | POA: Diagnosis not present

## 2022-05-13 NOTE — Telephone Encounter (Signed)
Requested medication (s) are due for refill today:   No  Requested medication (s) are on the active medication list:   Yes  Future visit scheduled:   Yes   Last ordered: 04/21/2022 #30, 1 refill  Returned because a 90 day supply is being requested.     Requested Prescriptions  Pending Prescriptions Disp Refills   chlorthalidone (HYGROTON) 25 MG tablet [Pharmacy Med Name: CHLORTHALIDONE 25 MG TABLET] 90 tablet 1    Sig: Take 1 tablet (25 mg total) by mouth daily.     Cardiovascular: Diuretics - Thiazide Failed - 05/13/2022  1:31 PM      Failed - Last BP in normal range    BP Readings from Last 1 Encounters:  04/21/22 (!) 148/89         Passed - Cr in normal range and within 180 days    Creatinine  Date Value Ref Range Status  04/21/2022 107.3 20.0 - 300.0 mg/dL Final   Creat  Date Value Ref Range Status  10/29/2014 0.90 0.50 - 1.35 mg/dL Final   Creatinine, Ser  Date Value Ref Range Status  04/21/2022 1.03 0.76 - 1.27 mg/dL Final         Passed - K in normal range and within 180 days    Potassium  Date Value Ref Range Status  04/21/2022 4.5 3.5 - 5.2 mmol/L Final         Passed - Na in normal range and within 180 days    Sodium  Date Value Ref Range Status  04/21/2022 143 134 - 144 mmol/L Final         Passed - Valid encounter within last 6 months    Recent Outpatient Visits           3 weeks ago Primary hypertension   Darren Larsen, Darren Larsen, Darren Larsen   1 year ago Primary hypertension   [place] Elsie Stain, Darren Larsen   1 year ago Chronic bilateral low back pain with left-sided sciatica   Darren Larsen, Darren Larsen, Darren Larsen   2 years ago Essential hypertension   [place], [place], [place]   2 years ago Essential hypertension   [place], R[facility]       Future Appointments              In 1 month Joya Gaskins [place], Darren Larsen [place]

## 2022-05-17 DIAGNOSIS — H2513 Age-related nuclear cataract, bilateral: Secondary | ICD-10-CM | POA: Diagnosis not present

## 2022-06-16 ENCOUNTER — Telehealth: Payer: Self-pay | Admitting: Critical Care Medicine

## 2022-06-16 NOTE — Telephone Encounter (Signed)
Called and left voice mail

## 2022-06-16 NOTE — Telephone Encounter (Signed)
Pt missed his appt with [facility] , call him to inquire why and if he wants to reschedule  To reopen the referral simply have him call [phone]

## 2022-06-17 ENCOUNTER — Telehealth: Payer: Self-pay | Admitting: Critical Care Medicine

## 2022-06-17 NOTE — Telephone Encounter (Signed)
FYI

## 2022-06-17 NOTE — Telephone Encounter (Signed)
Pt lives in [place] and needs a ride to his appt on Tues 06/21/22 at 8:30 am.  Pt sees Dr Joya Gaskins.  His [place] address confirmed

## 2022-06-19 NOTE — Progress Notes (Unsigned)
Established Patient Office Visit  Subjective   Patient ID: Fleming Prill, male    DOB: [birth year]  Age: 56 y.o. MRN: [id number]  No chief complaint on file.   04/21/22 Not seen since 01/2021 HTN, LBP   s/p THR Left hip 05/2021  This a 56 year old male seen in return follow-up not seen July 2022.  He has had a left total hip replacement but now has low back pain radiating into the right groin.  He states gabapentin had caused side effects.  He now lives in [place] housing.  On arrival blood pressure 148/89.  He has not been compliant with some of his medications.  He is looking for pain management.  He drinks 1 beer occasionally.  Smokes about 1 cigarette every other day  06/21/22 Missed pain clinic appt   Cardiovascular and Mediastinum  HTN (hypertension) - Primary (Chronic)   Hypertension currently not controlled plan to begin chlorthalidone daily continue amlodipine and add valsartan daily and check labs    Relevant Medications  amLODipine (NORVASC) 10 MG tablet  valsartan (DIOVAN) 160 MG tablet  Other Relevant Orders  CBC with Differential/Platelet (Completed)  Comprehensive metabolic panel (Completed)  Lipid panel (Completed)   Digestive  Gastroesophageal reflux  Relevant Orders  CBC with Differential/Platelet (Completed)   Nervous and Auditory  Chronic bilateral low back pain with left-sided sciatica   Give short-term dose of tramadol as needed renew muscle relaxants renew topical Voltaren gel and refer to orthopedics and chronic pain management in [place]    Relevant Medications  methocarbamol (ROBAXIN) 500 MG tablet  Other Relevant Orders  [id number] 11+Oxyco+Alc+Crt-Bund (Completed)  Ambulatory referral to Orthopedic Surgery  Ambulatory referral to Pain Clinic   Musculoskeletal and Integument  Claw hand of right upper extremity   Referral to orthopedics made    Relevant Orders  Ambulatory referral to Orthopedic Surgery   Other  Tobacco  dependence   Patient is nearly completed tobacco use    Chronic pain syndrome  Relevant Medications  methocarbamol (ROBAXIN) 500 MG tablet  Other Relevant Orders  [id number] 11+Oxyco+Alc+Crt-Bund (Completed)  Ambulatory referral to Pain Clinic  Other Visit Diagnoses    Need for hepatitis C screening test      Relevant Orders  HCV Ab w Reflex to Quant PCR (Completed)    Patient Active Problem List   Diagnosis Date Noted  . Claw hand of right upper extremity 04/21/2022  . Chronic pain syndrome 04/21/2022  . Status post total replacement of left hip 05/03/2021  . Gastroesophageal reflux 01/25/2021  . Tobacco dependence 01/25/2021  . Chronic bilateral low back pain with left-sided sciatica 11/24/2020  . Bilateral hand numbness 10/29/2014  . Onychomycosis of toenail 10/29/2014  . HTN (hypertension) 10/29/2014  . Retinitis pigmentosa    Past Medical History:  Diagnosis Date  . Anxiety   . Arthritis    hands  . Asthma   . Cataract   . GERD (gastroesophageal reflux disease)   . History of concussion    per pt concussion without LOC 1980s -- no residuals  . History of seizure    none in 30 years ago- none since then  . Hypertension   . Retinitis pigmentosa congenital   . Seizures ([place])   . Spermatocele    left  . Spermatocele of epididymis, unspecified 04/17/2018  . Wears glasses    Past Surgical History:  Procedure Laterality Date  . COLONOSCOPY     greater than 30 years  . INGUINAL HERNIA REPAIR Left  infant;  age 55;  65s  . SPERMATOCELECTOMY Left 06/26/2018   Procedure: SPERMATOCELECTOMY;  Surgeon: Ceasar Mons, MD;  Location: [facility];  Service: Urology;  Laterality: Left;  . TOTAL HIP ARTHROPLASTY Left 05/03/2021   Procedure: LEFT TOTAL HIP ARTHROPLASTY ANTERIOR APPROACH;  Surgeon: Leandrew Koyanagi, MD;  Location: [facility];  Service: Orthopedics;  Laterality: Left;  3-C   Social History   Tobacco Use  . Smoking status: Some Days    Packs/day: 0.50     Years: 20.00    Total pack years: 10.00    Types: Cigarettes  . Smokeless tobacco: Never  . Tobacco comments:    recently stopped smoking approx. 06-14-2018  Vaping Use  . Vaping Use: Never used  Substance Use Topics  . Alcohol use: Yes    Comment: pt states he drinks "about a fifth evry week"  . Drug use: Yes    Types: Marijuana    Comment: twice a week, if that, per pt   Social History   Socioeconomic History  . Marital status: Legally Separated    Spouse name: Not on file  . Number of children: 5  . Years of education: 68   . Highest education level: Not on file  Occupational History  . Occupation: Biomedical engineer   . Occupation: Ambulance person   Tobacco Use  . Smoking status: Some Days    Packs/day: 0.50    Years: 20.00    Total pack years: 10.00    Types: Cigarettes  . Smokeless tobacco: Never  . Tobacco comments:    recently stopped smoking approx. 06-14-2018  Vaping Use  . Vaping Use: Never used  Substance and Sexual Activity  . Alcohol use: Yes    Comment: pt states he drinks "about a fifth evry week"  . Drug use: Yes    Types: Marijuana    Comment: twice a week, if that, per pt  . Sexual activity: Not on file  Other Topics Concern  . Not on file  Social History Narrative   Live with niece   Children grown and gone.   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    Social Determinants of Health   Financial Resource Strain: Not on file  Food Insecurity: Not on file  Transportation Needs: Not on file  Physical Activity: Not on file  Stress: Not on file  Social Connections: Not on file  Intimate Partner Violence: Not on file   Family Status  Relation Name Status  . Mother  Deceased  . Father  Deceased  . Neg Hx  (Not Specified)   Family History  Problem Relation Age of Onset  . Hypertension Mother   . Colon cancer Neg Hx   . Esophageal cancer Neg Hx   . Rectal cancer Neg Hx   . Stomach  cancer Neg Hx    Allergies  Allergen Reactions  . Gabapentin Itching    Review of Systems  Constitutional:  Negative for chills, diaphoresis, fever, malaise/fatigue and weight loss.  HENT:  Negative for congestion, hearing loss, nosebleeds, sore throat and tinnitus.   Eyes:  Negative for blurred vision, photophobia and redness.  Respiratory:  Negative for cough, hemoptysis, sputum production, shortness of breath, wheezing and stridor.   Cardiovascular:  Negative for chest pain, palpitations, orthopnea, claudication, leg swelling and PND.  Gastrointestinal:  Negative for abdominal pain, blood in stool, constipation, diarrhea, heartburn, nausea and  vomiting.  Genitourinary:  Negative for dysuria, flank pain, frequency, hematuria and urgency.  Musculoskeletal:  Positive for back pain and neck pain. Negative for falls, joint pain and myalgias.  Skin:  Negative for itching and rash.  Neurological:  Negative for dizziness, tingling, tremors, sensory change, speech change, focal weakness, seizures, loss of consciousness, weakness and headaches.  Endo/Heme/Allergies:  Negative for environmental allergies and polydipsia. Does not bruise/bleed easily.  Psychiatric/Behavioral:  Negative for depression, memory loss, substance abuse and suicidal ideas. The patient is not nervous/anxious and does not have insomnia.       Objective:     There were no vitals taken for this visit. BP Readings from Last 3 Encounters:  04/21/22 (!) 148/89  05/04/21 (!) 138/95  04/22/21 120/88   Wt Readings from Last 3 Encounters:  04/21/22 212 lb (96.2 kg)  05/03/21 234 lb (106.1 kg)  04/22/21 234 lb 11.2 oz (106.5 kg)      Physical Exam Vitals reviewed.  Constitutional:      Appearance: Normal appearance. He is well-developed. He is not diaphoretic.  HENT:     Head: Normocephalic and atraumatic.     Nose: No nasal deformity, septal deviation, mucosal edema or rhinorrhea.     Right Sinus: No maxillary  sinus tenderness or frontal sinus tenderness.     Left Sinus: No maxillary sinus tenderness or frontal sinus tenderness.     Mouth/Throat:     Pharynx: No oropharyngeal exudate.  Eyes:     General: No scleral icterus.    Conjunctiva/sclera: Conjunctivae normal.     Pupils: Pupils are equal, round, and reactive to light.  Neck:     Thyroid: No thyromegaly.     Vascular: No carotid bruit or JVD.     Trachea: Trachea normal. No tracheal tenderness or tracheal deviation.  Cardiovascular:     Rate and Rhythm: Normal rate and regular rhythm.     Chest Wall: PMI is not displaced.     Pulses: Normal pulses. No decreased pulses.     Heart sounds: Normal heart sounds, S1 normal and S2 normal. Heart sounds not distant. No murmur heard.    No systolic murmur is present.     No diastolic murmur is present.     No friction rub. No gallop. No S3 or S4 sounds.  Pulmonary:     Effort: No tachypnea, accessory muscle usage or respiratory distress.     Breath sounds: No stridor. No decreased breath sounds, wheezing, rhonchi or rales.  Chest:     Chest wall: No tenderness.  Abdominal:     General: Bowel sounds are normal. There is no distension.     Palpations: Abdomen is soft. Abdomen is not rigid.     Tenderness: There is no abdominal tenderness. There is no guarding or rebound.  Genitourinary:    Penis: Normal.      Testes: Normal.  Musculoskeletal:        General: Tenderness and deformity present. Normal range of motion.     Cervical back: Normal range of motion and neck supple. No edema, erythema or rigidity. No muscular tenderness. Normal range of motion.     Comments: Clawhand deformity right hand Bilateral lumbar paraspinal tenderness  Lymphadenopathy:     Head:     Right side of head: No submental or submandibular adenopathy.     Left side of head: No submental or submandibular adenopathy.     Cervical: No cervical adenopathy.  Skin:    General: Skin  is warm and dry.     Coloration:  Skin is not pale.     Findings: No rash.     Nails: There is no clubbing.  Neurological:     Mental Status: He is alert and oriented to person, place, and time.     Sensory: No sensory deficit.  Psychiatric:        Speech: Speech normal.        Behavior: Behavior normal.     No results found for any visits on 06/21/22.   Last CBC Lab Results  Component Value Date   WBC 8.5 04/21/2022   HGB 12.9 (L) 04/21/2022   HCT 41.1 04/21/2022   MCV 74 (L) 04/21/2022   MCH 23.3 (L) 04/21/2022   RDW 16.8 (H) 04/21/2022   PLT 192 29/47/6546   Last metabolic panel Lab Results  Component Value Date   GLUCOSE 94 04/21/2022   NA 143 04/21/2022   K 4.5 04/21/2022   CL 106 04/21/2022   CO2 22 04/21/2022   BUN 14 04/21/2022   CREATININE 1.03 04/21/2022   GFRNONAA >60 05/04/2021   CALCIUM 10.0 04/21/2022   PROT 6.8 04/21/2022   ALBUMIN 4.4 04/21/2022   LABGLOB 2.4 04/21/2022   AGRATIO 1.8 04/21/2022   BILITOT 0.2 04/21/2022   ALKPHOS 108 04/21/2022   AST 17 04/21/2022   ALT 13 04/21/2022   ANIONGAP 7 05/04/2021   Last lipids Lab Results  Component Value Date   CHOL 130 04/21/2022   HDL 47 04/21/2022   LDLCALC 68 04/21/2022   TRIG 72 04/21/2022   CHOLHDL 2.8 04/21/2022      The 10-year ASCVD risk score (Arnett DK, et al., 2019) is: 22.9%    Assessment & Plan:   Problem List Items Addressed This Visit   None 38 minutes spent extra time needed for patient education and review of multiple systems multiple orders high complexity  No follow-ups on file.    Asencion Noble, MD

## 2022-06-20 NOTE — Telephone Encounter (Signed)
The patient called back and said he does not have a ride to his appointment at [facility] tomorrow morning.  He is not sure he would be here but agreed to a telephone visit with Dr Joya Gaskins.    I explained to him that he can contact his insurance company and schedule rides to his appointments with a 2-3 day notice. Marland Kitchen He said he is not sure how to do that and I explained to him that he just needs to call the insurance company number on his card and let them know what he needs. He then said that he will work on this.   I sent a message to [employer] inquiring about the policy for scheduling transportation [place] of [place].

## 2022-06-20 NOTE — Telephone Encounter (Signed)
I called patient and had to leave a message requesting a call back.  His appointment is tomorrow morning and because he is out of the county, I sent a message to [employer] inquiring if it is possible to schedule a ride for him

## 2022-06-21 ENCOUNTER — Ambulatory Visit (HOSPITAL_BASED_OUTPATIENT_CLINIC_OR_DEPARTMENT_OTHER): Payer: Medicare Other | Admitting: Critical Care Medicine

## 2022-06-21 ENCOUNTER — Other Ambulatory Visit: Payer: Self-pay | Admitting: Critical Care Medicine

## 2022-06-21 ENCOUNTER — Encounter: Payer: Self-pay | Admitting: Critical Care Medicine

## 2022-06-21 DIAGNOSIS — Z91199 Patient's noncompliance with other medical treatment and regimen due to unspecified reason: Secondary | ICD-10-CM

## 2022-06-21 MED ORDER — CHLORTHALIDONE 25 MG PO TABS: 25.0000 mg | ORAL_TABLET | Freq: Every day | ORAL | 0 refills | Status: DC

## 2022-06-21 MED ORDER — AMLODIPINE BESYLATE 10 MG PO TABS: 10.0000 mg | ORAL_TABLET | Freq: Every day | ORAL | 1 refills | Status: DC

## 2022-06-21 NOTE — Telephone Encounter (Signed)
Requested medication (s) are due for refill today:   Requested medication (s) are on the active medication list: No  Last refill:  04/21/22  Future visit scheduled: Yes  Notes to clinic:  Not on medication list.    Requested Prescriptions  Pending Prescriptions Disp Refills   traMADol (ULTRAM) 50 MG tablet 50 tablet 0    Sig: Take 1 tablet (50 mg total) by mouth every 8 (eight) hours as needed for up to 5 days.     Not Delegated - Analgesics:  Opioid Agonists Failed - 06/21/2022  9:59 AM      Failed - This refill cannot be delegated      Passed - Urine Drug Screen completed in last 360 days      Passed - Valid encounter within last 3 months    Recent Outpatient Visits           Today No-show for appointment   [place] Elsie Stain, MD   2 months ago Primary hypertension   [place], Patrick E, MD   1 year ago Primary hypertension   [place] Elsie Stain, MD   1 year ago Chronic bilateral low back pain with left-sided sciatica   [place] Elsie Stain, MD   2 years ago Essential hypertension   [place], Levada Dy M, [place]       Future Appointments             In 2 months Elsie Stain, MD [place]            Signed Prescriptions Disp Refills   amLODipine (NORVASC) 10 MG tablet 30 tablet 1    Sig: Take 1 tablet (10 mg total) by mouth daily. to lower blood pressure     Cardiovascular: Calcium Channel Blockers 2 Failed - 06/21/2022  9:59 AM      Failed - Last BP in normal range    BP Readings from Last 1 Encounters:  04/21/22 (!) 148/89         Passed - Last Heart Rate in normal range    Pulse Readings from Last 1 Encounters:  04/21/22 72         Passed - Valid encounter within last 6 months    Recent Outpatient Visits            Today No-show for appointment   [place] Elsie Stain, MD   2 months ago Primary hypertension   [place], Patrick E, MD   1 year ago Primary hypertension   [place] Elsie Stain, MD   1 year ago Chronic bilateral low back pain with left-sided sciatica   [place] Elsie Stain, MD   2 years ago Essential hypertension   [place], [place]       Future Appointments             In 2 months Elsie Stain, MD [place]             chlorthalidone (HYGROTON) 25 MG tablet 90 tablet 0    Sig: Take 1 tablet (25 mg total) by mouth daily.     Cardiovascular: Diuretics -  Thiazide Failed - 06/21/2022  9:59 AM      Failed - Last BP in normal range    BP Readings from Last 1 Encounters:  04/21/22 (!) 148/89         Passed - Cr in normal range and within 180 days    Creatinine  Date Value Ref Range Status  04/21/2022 107.3 20.0 - 300.0 mg/dL Final   Creat  Date Value Ref Range Status  10/29/2014 0.90 0.50 - 1.35 mg/dL Final   Creatinine, Ser  Date Value Ref Range Status  04/21/2022 1.03 0.76 - 1.27 mg/dL Final         Passed - K in normal range and within 180 days    Potassium  Date Value Ref Range Status  04/21/2022 4.5 3.5 - 5.2 mmol/L Final         Passed - Na in normal range and within 180 days    Sodium  Date Value Ref Range Status  04/21/2022 143 134 - 144 mmol/L Final         Passed - Valid encounter within last 6 months    Recent Outpatient Visits           Today No-show for appointment   [place] Elsie Stain, MD   2 months ago Primary hypertension   [place], Patrick E, MD   1 year ago Primary hypertension   [place]  Elsie Stain, MD   1 year ago Chronic bilateral low back pain with left-sided sciatica   [place] Elsie Stain, MD   2 years ago Essential hypertension   [place], [place]       Future Appointments             In 2 months Joya Gaskins Burnett Harry, MD [place]

## 2022-06-21 NOTE — Telephone Encounter (Signed)
Medication Refill - Medication: amLODipine (NORVASC) 10 MG tablet, chlorthalidone (HYGROTON) 25 MG tablet, traMADol (ULTRAM) 50 MG tablet   Pt missed his appointment this morning with PCP. Rescheduled for first available.  Has the patient contacted their pharmacy? No. (Agent: If no, request that the patient contact the pharmacy for the refill. If patient does not wish to contact the pharmacy document the reason why and proceed with request.)  [id number] [address] [id number] Phone: [phone]: [phone] Hours: Not open 24 hours   Preferred Pharmacy (with phone number or street name):  Has the patient been seen for an appointment in the last year OR does the patient have an upcoming appointment? Yes.    Agent: Please be advised that RX refills may take up to 3 business days. We ask that you follow-up with your pharmacy.

## 2022-06-21 NOTE — Progress Notes (Signed)
Patient did not answer the phone x 3

## 2022-06-22 ENCOUNTER — Telehealth: Payer: Self-pay | Admitting: Critical Care Medicine

## 2022-06-22 DIAGNOSIS — G8929 Other chronic pain: Secondary | ICD-10-CM

## 2022-06-22 DIAGNOSIS — G894 Chronic pain syndrome: Secondary | ICD-10-CM

## 2022-06-22 MED ORDER — TRAMADOL HCL 50 MG PO TABS: 50.0000 mg | ORAL_TABLET | Freq: Three times a day (TID) | ORAL | 0 refills | Status: AC | PRN

## 2022-06-22 NOTE — Telephone Encounter (Signed)
I spoke to this patient I will refill his tramadol 1 more time I told him he missed his pain clinic appointment and [facility] I am going to send another referral and he needs to see me face-to-face we will engage him and get an appointment

## 2022-06-28 NOTE — Telephone Encounter (Signed)
Called patient and he is aware I will send a letter with referral information and next appointment time

## 2022-06-30 ENCOUNTER — Encounter: Payer: Self-pay | Admitting: Critical Care Medicine

## 2022-07-06 DIAGNOSIS — H2512 Age-related nuclear cataract, left eye: Secondary | ICD-10-CM | POA: Diagnosis not present

## 2022-07-06 DIAGNOSIS — H2513 Age-related nuclear cataract, bilateral: Secondary | ICD-10-CM | POA: Diagnosis not present

## 2022-07-12 DIAGNOSIS — H25812 Combined forms of age-related cataract, left eye: Secondary | ICD-10-CM | POA: Insufficient documentation

## 2022-08-25 ENCOUNTER — Ambulatory Visit: Payer: Medicare (Managed Care) | Attending: Critical Care Medicine | Admitting: Critical Care Medicine

## 2022-08-25 ENCOUNTER — Encounter: Payer: Self-pay | Admitting: Critical Care Medicine

## 2022-08-25 VITALS — BP 116/72 | HR 89 | Ht 69.0 in | Wt 196.6 lb

## 2022-08-25 DIAGNOSIS — Z79899 Other long term (current) drug therapy: Secondary | ICD-10-CM | POA: Insufficient documentation

## 2022-08-25 DIAGNOSIS — F17200 Nicotine dependence, unspecified, uncomplicated: Secondary | ICD-10-CM | POA: Diagnosis not present

## 2022-08-25 DIAGNOSIS — I10 Essential (primary) hypertension: Secondary | ICD-10-CM

## 2022-08-25 DIAGNOSIS — M21511 Acquired clawhand, right hand: Secondary | ICD-10-CM | POA: Diagnosis not present

## 2022-08-25 DIAGNOSIS — G629 Polyneuropathy, unspecified: Secondary | ICD-10-CM

## 2022-08-25 DIAGNOSIS — Z87891 Personal history of nicotine dependence: Secondary | ICD-10-CM | POA: Diagnosis not present

## 2022-08-25 MED ORDER — DICLOFENAC SODIUM 1 % EX GEL: 2.0000 g | Freq: Four times a day (QID) | CUTANEOUS | 3 refills | Status: AC | PRN

## 2022-08-25 MED ORDER — CHLORTHALIDONE 25 MG PO TABS: 25.0000 mg | ORAL_TABLET | Freq: Every day | ORAL | 2 refills | Status: AC

## 2022-08-25 MED ORDER — FEXOFENADINE HCL 180 MG PO TABS: 180.0000 mg | ORAL_TABLET | Freq: Every day | ORAL | 2 refills | Status: AC | PRN

## 2022-08-25 MED ORDER — VALSARTAN 160 MG PO TABS: 160.0000 mg | ORAL_TABLET | Freq: Every day | ORAL | 3 refills | Status: AC

## 2022-08-25 NOTE — Assessment & Plan Note (Signed)
Referral to neurology made

## 2022-08-25 NOTE — Assessment & Plan Note (Addendum)
    Current smoking consumption amount: 1/2 pack daily  Dicsussion on advise to quit smoking and smoking impacts: Blood pressure impacts  Patient's willingness to quit: Wants to quit  Methods to quit smoking discussed: Nicotine replacement  Medication management of smoking session drugs discussed: Nicotine lozenge  Resources provided:  AVS   Setting quit date not established  Follow-up arranged 4 months   Time spent counseling the patient:   5 minutes

## 2022-08-25 NOTE — Progress Notes (Signed)
Established Patient Office Visit  Subjective   Patient ID: Darren Larsen, male    DOB: Nov 18, 1965  Age: 57 y.o. MRN: [id number]  Chief Complaint  Patient presents with   Hypertension    04/2022 Not seen since 01/2021 HTN, LBP   s/p THR Left hip 05/2021  This a 57 year old male seen in return follow-up not seen July 2022.  He has had a left total hip replacement but now has low back pain radiating into the right groin.  He states gabapentin had caused side effects.  He now lives in [place] has housing.  On arrival blood pressure 148/89.  He has not been compliant with some of his medications.  He is looking for pain management.  He drinks 1 beer occasionally.  Smokes about 1 cigarette every other day.  08/25/22 Patient seen in return follow-up from October visit patient living in [place] at this time.  He has a claw deformity of the right hand and was seen by orthopedics.  They did not see any surgical intervention.  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  Patient is yet to see neurology.  He is also had left cataract surgery and is recovering from this.  Patient also has hypertension on arrival blood pressure is excellent 116/72.  Also has chronic low back pain which is stable at this time.  He has ongoing tobacco use patient does not drink alcohol.    Patient Active Problem List   Diagnosis Date Noted   Combined forms of age-related cataract of left eye 07/12/2022   Claw hand of right upper extremity 04/21/2022   Chronic pain syndrome 04/21/2022   Status post total replacement of left hip 05/03/2021   Gastroesophageal reflux 01/25/2021   Tobacco dependence 01/25/2021   Chronic bilateral low back pain with left-sided sciatica 11/24/2020   Bilateral hand numbness 10/29/2014   Onychomycosis of toenail 10/29/2014   HTN (hypertension) 10/29/2014   Retinitis pigmentosa    Past Medical History:  Diagnosis  Date   Anxiety    Arthritis    hands   Asthma    Cataract    GERD (gastroesophageal reflux disease)    History of concussion    per pt concussion without LOC 1980s -- no residuals   History of seizure    none in 30 years ago- none since then   Hypertension    Retinitis pigmentosa congenital    Seizures ([place])    Spermatocele    left   Spermatocele of epididymis, unspecified 04/17/2018   Wears glasses    Past Surgical History:  Procedure Laterality Date   COLONOSCOPY     greater than 30 years   INGUINAL HERNIA REPAIR Left infant;  age 33;  52s   SPERMATOCELECTOMY Left 06/26/2018   Procedure: SPERMATOCELECTOMY;  Surgeon: Ceasar Mons, MD;  Location: [facility];  Service: Urology;  Laterality: Left;   TOTAL HIP ARTHROPLASTY Left 05/03/2021   Procedure: LEFT TOTAL HIP ARTHROPLASTY ANTERIOR APPROACH;  Surgeon: Leandrew Koyanagi, MD;  Location: [facility];  Service: Orthopedics;  Laterality: Left;  3-C   Social History   Tobacco Use   Smoking status: Some Days    Packs/day: 0.50    Years: 20.00    Total pack years: 10.00    Types: Cigarettes   Smokeless tobacco: Never   Tobacco comments:    recently stopped smoking approx. 06-14-2018  Vaping Use   Vaping Use: Former  Substance Use Topics   Alcohol use: Yes    Comment: pt states he drinks "about a fifth evry week"   Drug use: Yes    Types: Marijuana    Comment: twice a week, if that, per pt   Social History   Socioeconomic History   Marital status: Legally Separated    Spouse name: Not on file   Number of children: 5   Years of education: 12    Highest education level: Not on file  Occupational History   Occupation: Carpentry    Occupation: Ambulance person   Tobacco Use   Smoking status: Some Days    Packs/day: 0.50    Years: 20.00    Total pack years: 10.00    Types: Cigarettes   Smokeless tobacco: Never   Tobacco comments:    recently stopped smoking approx. 06-14-2018  Vaping Use   Vaping Use: Former   Substance and Sexual Activity   Alcohol use: Yes    Comment: pt states he drinks "about a fifth evry week"   Drug use: Yes    Types: Marijuana    Comment: twice a week, if that, per pt   Sexual activity: Not on file  Other Topics Concern   Not on file  Social History Narrative   Live with niece   Children grown and gone.   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    Social Determinants of Health   Financial Resource Strain: Not on file  Food Insecurity: Not on file  Transportation Needs: Not on file  Physical Activity: Not on file  Stress: Not on file  Social Connections: Not on file  Intimate Partner Violence: Not on file   Family Status  Relation Name Status   Mother  Deceased   Father  Deceased   Neg Hx  (Not Specified)   Family History  Problem Relation Age of Onset   Hypertension Mother    Colon cancer Neg Hx    Esophageal cancer Neg Hx    Rectal cancer Neg Hx    Stomach cancer Neg Hx    Allergies  Allergen Reactions   Gabapentin Itching    Review of Systems  Constitutional:  Negative for chills, diaphoresis, fever, malaise/fatigue and weight loss.  HENT:  Negative for congestion, hearing loss, nosebleeds, sore throat and tinnitus.   Eyes:  Negative for blurred vision, photophobia and redness.  Respiratory:  Negative for cough, hemoptysis, sputum production, shortness of breath, wheezing and stridor.   Cardiovascular:  Negative for chest pain, palpitations, orthopnea, claudication, leg swelling and PND.  Gastrointestinal:  Negative for abdominal pain, blood in stool, constipation, diarrhea, heartburn, nausea and vomiting.  Genitourinary:  Negative for dysuria, flank pain, frequency, hematuria and urgency.  Musculoskeletal:  Positive for back pain and neck pain. Negative for falls, joint pain and myalgias.  Skin:  Negative for itching and rash.  Neurological:  Negative for dizziness,  tingling, tremors, sensory change, speech change, focal weakness, seizures, loss of consciousness, weakness and headaches.  Endo/Heme/Allergies:  Negative for environmental allergies and polydipsia. Does not bruise/bleed easily.  Psychiatric/Behavioral:  Negative for depression, memory loss, substance abuse and suicidal ideas. The patient is not nervous/anxious and does not have insomnia.       Objective:     BP 116/72   Pulse 89   Ht 5' 9"$  (1.753 m)   Wt 196 lb 9.6 oz (89.2 kg)  SpO2 98%   BMI 29.03 kg/m  BP Readings from Last 3 Encounters:  08/25/22 116/72  04/21/22 (!) 148/89  05/04/21 (!) 138/95   Wt Readings from Last 3 Encounters:  08/25/22 196 lb 9.6 oz (89.2 kg)  04/21/22 212 lb (96.2 kg)  05/03/21 234 lb (106.1 kg)      Physical Exam Vitals reviewed.  Constitutional:      Appearance: Normal appearance. He is well-developed. He is not diaphoretic.  HENT:     Head: Normocephalic and atraumatic.     Nose: No nasal deformity, septal deviation, mucosal edema or rhinorrhea.     Right Sinus: No maxillary sinus tenderness or frontal sinus tenderness.     Left Sinus: No maxillary sinus tenderness or frontal sinus tenderness.     Mouth/Throat:     Pharynx: No oropharyngeal exudate.  Eyes:     General: No scleral icterus.    Conjunctiva/sclera: Conjunctivae normal.     Pupils: Pupils are equal, round, and reactive to light.  Neck:     Thyroid: No thyromegaly.     Vascular: No carotid bruit or JVD.     Trachea: Trachea normal. No tracheal tenderness or tracheal deviation.  Cardiovascular:     Rate and Rhythm: Normal rate and regular rhythm.     Chest Wall: PMI is not displaced.     Pulses: Normal pulses. No decreased pulses.     Heart sounds: Normal heart sounds, S1 normal and S2 normal. Heart sounds not distant. No murmur heard.    No systolic murmur is present.     No diastolic murmur is present.     No friction rub. No gallop. No S3 or S4 sounds.  Pulmonary:      Effort: No tachypnea, accessory muscle usage or respiratory distress.     Breath sounds: No stridor. No decreased breath sounds, wheezing, rhonchi or rales.  Chest:     Chest wall: No tenderness.  Abdominal:     General: Bowel sounds are normal. There is no distension.     Palpations: Abdomen is soft. Abdomen is not rigid.     Tenderness: There is no abdominal tenderness. There is no guarding or rebound.  Genitourinary:    Penis: Normal.      Testes: Normal.  Musculoskeletal:        General: Tenderness and deformity present. Normal range of motion.     Cervical back: Normal range of motion and neck supple. No edema, erythema or rigidity. No muscular tenderness. Normal range of motion.     Comments: Clawhand deformity right hand Bilateral lumbar paraspinal tenderness  Lymphadenopathy:     Head:     Right side of head: No submental or submandibular adenopathy.     Left side of head: No submental or submandibular adenopathy.     Cervical: No cervical adenopathy.  Skin:    General: Skin is warm and dry.     Coloration: Skin is not pale.     Findings: No rash.     Nails: There is no clubbing.  Neurological:     Mental Status: He is alert and oriented to person, place, and time.     Sensory: No sensory deficit.  Psychiatric:        Speech: Speech normal.        Behavior: Behavior normal.      No results found for any visits on 08/25/22.   Last CBC Lab Results  Component Value Date   WBC 8.5 04/21/2022  HGB 12.9 (L) 04/21/2022   HCT 41.1 04/21/2022   MCV 74 (L) 04/21/2022   MCH 23.3 (L) 04/21/2022   RDW 16.8 (H) 04/21/2022   PLT 192 [id number]   Last metabolic panel Lab Results  Component Value Date   GLUCOSE 94 04/21/2022   NA 143 04/21/2022   K 4.5 04/21/2022   CL 106 04/21/2022   CO2 22 04/21/2022   BUN 14 04/21/2022   CREATININE 1.03 04/21/2022   GFRNONAA >60 05/04/2021   CALCIUM 10.0 04/21/2022   PROT 6.8 04/21/2022   ALBUMIN 4.4 04/21/2022   LABGLOB  2.4 04/21/2022   AGRATIO 1.8 04/21/2022   BILITOT 0.2 04/21/2022   ALKPHOS 108 04/21/2022   AST 17 04/21/2022   ALT 13 04/21/2022   ANIONGAP 7 05/04/2021   Last lipids Lab Results  Component Value Date   CHOL 130 04/21/2022   HDL 47 04/21/2022   LDLCALC 68 04/21/2022   TRIG 72 04/21/2022   CHOLHDL 2.8 04/21/2022      The 10-year ASCVD risk score (Arnett DK, et al., 2019) is: 15%    Assessment & Plan:   Problem List Items Addressed This Visit       Cardiovascular and Mediastinum   HTN (hypertension) (Chronic)    Hypertension currently under good control we can actually bring his blood pressure medicine down to valsartan and 160 mg daily and chlorthalidone 25 mg daily discontinue amlodipine      Relevant Medications   chlorthalidone (HYGROTON) 25 MG tablet   valsartan (DIOVAN) 160 MG tablet     Musculoskeletal and Integument   Claw hand of right upper extremity - Primary    Referral to neurology made      Relevant Orders   Ambulatory referral to Neurology     Other   Tobacco dependence       Current smoking consumption amount: 1/2 pack daily  Dicsussion on advise to quit smoking and smoking impacts: Blood pressure impacts  Patient's willingness to quit: Wants to quit  Methods to quit smoking discussed: Nicotine replacement  Medication management of smoking session drugs discussed: Nicotine lozenge  Resources provided:  AVS   Setting quit date not established  Follow-up arranged 4 months   Time spent counseling the patient:   5 minutes      Other Visit Diagnoses     Neuropathy       Relevant Orders   Ambulatory referral to Neurology     38 minutes spent extra time needed for patient education and review of multiple systems multiple orders high complexity  Return in about 4 months (around 12/24/2022) for htn.    Asencion Noble, MD

## 2022-08-25 NOTE — Assessment & Plan Note (Signed)
Hypertension currently under good control we can actually bring his blood pressure medicine down to valsartan and 160 mg daily and chlorthalidone 25 mg daily discontinue amlodipine

## 2022-08-25 NOTE — Patient Instructions (Signed)
Neurology referral be made to : Neurology clinic [address] in [place]  The [id number]  Refills on all your medications will be sent to your pharmacy  Return to Dr. Joya Gaskins 4 months  Note you are stopping amlodipine but stay on chlorthalidone and valsartan HCT for blood pressure

## 2022-10-20 ENCOUNTER — Ambulatory Visit: Payer: Medicare (Managed Care) | Attending: Critical Care Medicine

## 2022-10-20 VITALS — Ht 69.0 in | Wt 196.0 lb

## 2022-10-20 DIAGNOSIS — F17210 Nicotine dependence, cigarettes, uncomplicated: Secondary | ICD-10-CM

## 2022-10-20 DIAGNOSIS — Z0120 Encounter for dental examination and cleaning without abnormal findings: Secondary | ICD-10-CM

## 2022-10-20 DIAGNOSIS — Z0000 Encounter for general adult medical examination without abnormal findings: Secondary | ICD-10-CM | POA: Diagnosis not present

## 2022-10-20 NOTE — Patient Instructions (Signed)
Mr. Darren Larsen , Thank you for taking time to come for your Medicare Wellness Visit. I appreciate your ongoing commitment to your health goals. Please review the following plan we discussed and let me know if I can assist you in the future.   These are the goals we discussed:  Goals      Blood Pressure < 140/90     Quit smoking / using tobacco        This is a list of the screening recommended for you and due dates:  Health Maintenance  Topic Date Due   Zoster (Shingles) Vaccine (1 of 2) 11/23/2022*   DTaP/Tdap/Td vaccine (2 - Td or Tdap) 01/23/2023   Flu Shot  02/09/2023   Medicare Annual Wellness Visit  10/20/2023   Colon Cancer Screening  03/01/2026   Hepatitis C Screening: USPSTF Recommendation to screen - Ages 18-79 yo.  Completed   HIV Screening  Completed   HPV Vaccine  Aged Out   COVID-19 Vaccine  Discontinued  *Topic was postponed. The date shown is not the original due date.    Advanced directives: no: mailed per request  Conditions/risks identified: moderate falls risk  Next appointment: Follow up in one year for your annual wellness visit 10/23/2023 @11am  telephone  Preventive Care 40-64 Years, Male Preventive care refers to lifestyle choices and visits with your health care provider that can promote health and wellness. What does preventive care include? A yearly physical exam. This is also called an annual well check. Dental exams once or twice a year. Routine eye exams. Ask your health care provider how often you should have your eyes checked. Personal lifestyle choices, including: Daily care of your teeth and gums. Regular physical activity. Eating a healthy diet. Avoiding tobacco and drug use. Limiting alcohol use. Practicing safe sex. Taking low-dose aspirin every day starting at age 13. What happens during an annual well check? The services and screenings done by your health care provider during your annual well check will depend on your age, overall  health, lifestyle risk factors, and family history of disease. Counseling  Your health care provider may ask you questions about your: Alcohol use. Tobacco use. Drug use. Emotional well-being. Home and relationship well-being. Sexual activity. Eating habits. Work and work Astronomer. Screening  You may have the following tests or measurements: Height, weight, and BMI. Blood pressure. Lipid and cholesterol levels. These may be checked every 5 years, or more frequently if you are over 47 years old. Skin check. Lung cancer screening. You may have this screening every year starting at age 44 if you have a 30-pack-year history of smoking and currently smoke or have quit within the past 15 years. Fecal occult blood test (FOBT) of the stool. You may have this test every year starting at age 42. Flexible sigmoidoscopy or colonoscopy. You may have a sigmoidoscopy every 5 years or a colonoscopy every 10 years starting at age 40. Prostate cancer screening. Recommendations will vary depending on your family history and other risks. Hepatitis C blood test. Hepatitis B blood test. Sexually transmitted disease (STD) testing. Diabetes screening. This is done by checking your blood sugar (glucose) after you have not eaten for a while (fasting). You may have this done every 1-3 years. Discuss your test results, treatment options, and if necessary, the need for more tests with your health care provider. Vaccines  Your health care provider may recommend certain vaccines, such as: Influenza vaccine. This is recommended every year. Tetanus, diphtheria, and acellular pertussis (  Tdap, Td) vaccine. You may need a Td booster every 10 years. Zoster vaccine. You may need this after age 5. Pneumococcal 13-valent conjugate (PCV13) vaccine. You may need this if you have certain conditions and have not been vaccinated. Pneumococcal polysaccharide (PPSV23) vaccine. You may need one or two doses if you smoke  cigarettes or if you have certain conditions. Talk to your health care provider about which screenings and vaccines you need and how often you need them. This information is not intended to replace advice given to you by your health care provider. Make sure you discuss any questions you have with your health care provider. Document Released: 07/24/2015 Document Revised: 03/16/2016 Document Reviewed: 04/28/2015 Elsevier Interactive Patient Education  2017 [place] Prevention in the Home Falls can cause injuries. They can happen to people of all ages. There are many things you can do to make your home safe and to help prevent falls. What can I do on the outside of my home? Regularly fix the edges of walkways and driveways and fix any cracks. Remove anything that might make you trip as you walk through a door, such as a raised step or threshold. Trim any bushes or trees on the path to your home. Use bright outdoor lighting. Clear any walking paths of anything that might make someone trip, such as rocks or tools. Regularly check to see if handrails are loose or broken. Make sure that both sides of any steps have handrails. Any raised decks and porches should have guardrails on the edges. Have any leaves, snow, or ice cleared regularly. Use sand or salt on walking paths during winter. Clean up any spills in your garage right away. This includes oil or grease spills. What can I do in the bathroom? Use night lights. Install grab bars by the toilet and in the tub and shower. Do not use towel bars as grab bars. Use non-skid mats or decals in the tub or shower. If you need to sit down in the shower, use a plastic, non-slip stool. Keep the floor dry. Clean up any water that spills on the floor as soon as it happens. Remove soap buildup in the tub or shower regularly. Attach bath mats securely with double-sided non-slip rug tape. Do not have throw rugs and other things on the floor that  can make you trip. What can I do in the bedroom? Use night lights. Make sure that you have a light by your bed that is easy to reach. Do not use any sheets or blankets that are too big for your bed. They should not hang down onto the floor. Have a firm chair that has side arms. You can use this for support while you get dressed. Do not have throw rugs and other things on the floor that can make you trip. What can I do in the kitchen? Clean up any spills right away. Avoid walking on wet floors. Keep items that you use a lot in easy-to-reach places. If you need to reach something above you, use a strong step stool that has a grab bar. Keep electrical cords out of the way. Do not use floor polish or wax that makes floors slippery. If you must use wax, use non-skid floor wax. Do not have throw rugs and other things on the floor that can make you trip. What can I do with my stairs? Do not leave any items on the stairs. Make sure that there are handrails on both sides of  the stairs and use them. Fix handrails that are broken or loose. Make sure that handrails are as long as the stairways. Check any carpeting to make sure that it is firmly attached to the stairs. Fix any carpet that is loose or worn. Avoid having throw rugs at the top or bottom of the stairs. If you do have throw rugs, attach them to the floor with carpet tape. Make sure that you have a light switch at the top of the stairs and the bottom of the stairs. If you do not have them, ask someone to add them for you. What else can I do to help prevent falls? Wear shoes that: Do not have high heels. Have rubber bottoms. Are comfortable and fit you well. Are closed at the toe. Do not wear sandals. If you use a stepladder: Make sure that it is fully opened. Do not climb a closed stepladder. Make sure that both sides of the stepladder are locked into place. Ask someone to hold it for you, if possible. Clearly mark and make sure that you  can see: Any grab bars or handrails. First and last steps. Where the edge of each step is. Use tools that help you move around (mobility aids) if they are needed. These include: Canes. Walkers. Scooters. Crutches. Turn on the lights when you go into a dark area. Replace any light bulbs as soon as they burn out. Set up your furniture so you have a clear path. Avoid moving your furniture around. If any of your floors are uneven, fix them. If there are any pets around you, be aware of where they are. Review your medicines with your doctor. Some medicines can make you feel dizzy. This can increase your chance of falling. Ask your doctor what other things that you can do to help prevent falls. This information is not intended to replace advice given to you by your health care provider. Make sure you discuss any questions you have with your health care provider. Document Released: 04/23/2009 Document Revised: 12/03/2015 Document Reviewed: 08/01/2014 Elsevier Interactive Patient Education  2017 [employer].

## 2022-10-20 NOTE — Progress Notes (Signed)
I connected with  Tracey Harries on 10/20/22 by a audio enabled telemedicine application and verified that I am speaking with the correct person using two identifiers.  Patient Location: Home  Provider Location: Office/Clinic  I discussed the limitations of evaluation and management by telemedicine. The patient expressed understanding and agreed to proceed.  Subjective:   Darren Larsen is a 57 y.o. male who presents for Medicare Annual/Subsequent preventive examination.  Review of Systems    Cardiac Risk Factors include: advanced age (>70men, >78 women);hypertension;male gender    Objective:    Today's Vitals   10/20/22 1100 10/20/22 1103  Weight: 196 lb (88.9 kg)   Height: 5\' 9"  (1.753 m)   PainSc:  0-No pain   Body mass index is 28.94 kg/m.     10/20/2022   11:19 AM 04/22/2021    2:09 PM 06/26/2018    7:33 AM 05/18/2018   10:34 AM 04/08/2018    7:33 PM 04/13/2017    2:44 PM 04/07/2017   12:56 PM  Advanced Directives  Does Patient Have a Medical Advance Directive? No No No No No No No  Would patient like information on creating a medical advance directive?  No - Patient declined No - Patient declined No - Patient declined No - Patient declined No - Patient declined No - Patient declined    Current Medications (verified) Outpatient Encounter Medications as of 10/20/2022  Medication Sig   diclofenac Sodium (VOLTAREN) 1 % GEL Apply 2 g topically 4 (four) times daily as needed (pain).   fexofenadine (ALLEGRA) 180 MG tablet Take 1 tablet (180 mg total) by mouth daily as needed for allergies or rhinitis.   Multiple Vitamins-Minerals (ZINC PO) Take 1 tablet by mouth daily.   valsartan (DIOVAN) 160 MG tablet Take 1 tablet (160 mg total) by mouth daily.   Vitamin D-Vitamin K (VITAMIN K2-VITAMIN D3 PO) Take 1 capsule by mouth daily.   chlorthalidone (HYGROTON) 25 MG tablet Take 1 tablet (25 mg total) by mouth daily. (Patient not taking: Reported on 10/20/2022)   No  facility-administered encounter medications on file as of 10/20/2022.    Allergies (verified) Gabapentin   History: Past Medical History:  Diagnosis Date   Anxiety    Arthritis    hands   Asthma    Cataract    GERD (gastroesophageal reflux disease)    History of concussion    per pt concussion without LOC 1980s -- no residuals   History of seizure    none in 30 years ago- none since then   Hypertension    Retinitis pigmentosa congenital    Seizures    Spermatocele    left   Spermatocele of epididymis, unspecified 04/17/2018   Wears glasses    Past Surgical History:  Procedure Laterality Date   COLONOSCOPY     greater than 30 years   INGUINAL HERNIA REPAIR Left infant;  age 88;  65s   SPERMATOCELECTOMY Left 06/26/2018   Procedure: SPERMATOCELECTOMY;  Surgeon: Rene Paci, MD;  Location: [facility];  Service: Urology;  Laterality: Left;   TOTAL HIP ARTHROPLASTY Left 05/03/2021   Procedure: LEFT TOTAL HIP ARTHROPLASTY ANTERIOR APPROACH;  Surgeon: Tarry Kos, MD;  Location: [facility];  Service: Orthopedics;  Laterality: Left;  3-C   Family History  Problem Relation Age of Onset   Hypertension Mother    Colon cancer Neg Hx    Esophageal cancer Neg Hx    Rectal cancer Neg Hx    Stomach cancer Neg  Hx    Social History   Socioeconomic History   Marital status: Legally Separated    Spouse name: Not on file   Number of children: 5   Years of education: 12    Highest education level: Not on file  Occupational History   Occupation: Carpentry    Occupation: Curator   Tobacco Use   Smoking status: Former    Packs/day: 0.50    Years: 20.00    Additional pack years: 0.00    Total pack years: 10.00    Types: Cigarettes    Quit date: 10/10/2022    Years since quitting: 0.0   Smokeless tobacco: Never   Tobacco comments:    recently stopped smoking approx. 06-14-2018  Vaping Use   Vaping Use: Former  Substance and Sexual Activity   Alcohol use: Not Currently     Comment: pt states he drinks "about a fifth evry week"   Drug use: Not Currently    Types: Marijuana    Comment: twice a week, if that, per pt   Sexual activity: Not on file  Other Topics Concern   Not on file  Social History Narrative   Live with niece   Children grown and gone.   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    Social Determinants of Health   Financial Resource Strain: High Risk (10/20/2022)   Overall Financial Resource Strain (CARDIA)    Difficulty of Paying Living Expenses: Very hard  Food Insecurity: No Food Insecurity (10/20/2022)   Hunger Vital Sign    Worried About Running Out of Food in the Last Year: Never true    Ran Out of Food in the Last Year: Never true  Transportation Needs: No Transportation Needs (10/20/2022)   PRAPARE - Administrator, Civil Service (Medical): No    Lack of Transportation (Non-Medical): No  Physical Activity: Sufficiently Active (10/20/2022)   Exercise Vital Sign    Days of Exercise per Week: 5 days    Minutes of Exercise per Session: 30 min  Stress: No Stress Concern Present (10/20/2022)   [employer] of Occupational Health - Occupational Stress Questionnaire    Feeling of Stress : Only a little  Social Connections: Socially Isolated (10/20/2022)   Social Connection and Isolation Panel [NHANES]    Frequency of Communication with Friends and Family: Once a week    Frequency of Social Gatherings with Friends and Family: Once a week    Attends Religious Services: Never    Database administrator or Organizations: No    Attends Banker Meetings: Never    Marital Status: Separated    Tobacco Counseling Counseling given: Not Answered Tobacco comments: recently stopped smoking approx. 06-14-2018   Clinical Intake:  Pre-visit preparation completed: Yes  Pain : No/denies pain Pain Score: 0-No pain   BMI - recorded:  28.94 Nutritional Status: BMI 25 -29 Overweight Nutritional Risks: Nausea/ vomitting/ diarrhea (some days:has medication) Diabetes: No  How often do you need to have someone help you when you read instructions, pamphlets, or other written materials from your doctor or pharmacy?: 1 - Never  Diabetic?NO  Interpreter Needed?: No  Comments: lives alone Information entered by :: B.Freeland Pracht,LPN   Activities of Daily Living    10/20/2022   11:23 AM  In your present state of health, do you have any difficulty performing the following activities:  Hearing? 0  Vision? 1  Difficulty concentrating or making decisions? 1  Comment memory problems:forgets  Walking or climbing stairs? 1  Dressing or bathing? 0  Doing errands, shopping? 1  Comment sometimes as does not drive  Preparing Food and eating ? N  Using the Toilet? N  In the past six months, have you accidently leaked urine? N  Do you have problems with loss of bowel control? N  Managing your Medications? N  Managing your Finances? N  Housekeeping or managing your Housekeeping? N    Patient Care Team: Storm Frisk, MD as PCP - General (Pulmonary Disease)  Indicate any recent Medical Services you may have received from other than [facility] providers in the past year (date may be approximate).     Assessment:   This is a routine wellness examination for [employer].  Hearing/Vision screen Hearing Screening - Comments:: Adequate hearing Vision Screening - Comments:: Vision cloudy after cataract surgery Dr.James Branch  Dietary issues and exercise activities discussed: Current Exercise Habits: Home exercise routine, Type of exercise: stretching (crunches and core execisec), Time (Minutes): 30, Frequency (Times/Week): 4, Weekly Exercise (Minutes/Week): 120, Intensity: Mild, Exercise limited by: orthopedic condition(s)   Goals Addressed             This Visit's Progress    Quit smoking / using tobacco   On track       Depression Screen    10/20/2022   11:11 AM 08/25/2022   11:20 AM 04/21/2022    9:10 AM 11/24/2020   10:38 AM 11/18/2019   10:48 AM 09/11/2018    2:26 PM 06/21/2018   11:19 AM  PHQ 2/9 Scores  PHQ - 2 Score 3 3 3 3 2 6 3   PHQ- 9 Score 8 7  6 14 13 14     Fall Risk    10/20/2022   11:07 AM 08/25/2022   11:18 AM 04/21/2022    9:10 AM 11/24/2020   10:34 AM 11/18/2019   10:49 AM  Fall Risk   Falls in the past year? 0 1 1 0 1  Number falls in past yr: 0 1 1 0 1  Injury with Fall? 0 0 0 0 1  Risk for fall due to : No Fall Risks No Fall Risks Other (Comment)    Risk for fall due to: Comment   legs give out    Follow up Education provided;Falls prevention discussed Falls evaluation completed   Falls evaluation completed    FALL RISK PREVENTION PERTAINING TO THE HOME:  Any stairs in or around the home? no If so, are there any without handrails? No  Home free of loose throw rugs in walkways, pet beds, electrical cords, etc? Yes  Adequate lighting in your home to reduce risk of falls? Yes   ASSISTIVE DEVICES UTILIZED TO PREVENT FALLS:  Life alert? No  Use of a cane, walker or w/c? Yes staff Grab bars in the bathroom? Yes  Shower chair or bench in shower? Yes does not use Elevated toilet seat or a handicapped toilet? Yes  does not use  Cognitive Function:        10/20/2022   11:26 AM  6CIT Screen  What Year? 0 points  What month? 0 points  What time? 0 points  Count back from 20 0 points  Months in reverse 2 points  Repeat phrase 2 points  Total Score 4 points    Immunizations Immunization History  Administered Date(s) Administered   Tdap 01/22/2013    TDAP  status: Up to date  Flu Vaccine status: Declined, Education has been provided regarding the importance of this vaccine but patient still declined. Advised may receive this vaccine at local pharmacy or Health Dept. Aware to provide a copy of the vaccination record if obtained from local pharmacy or Health Dept.  Verbalized acceptance and understanding.  Pneumococcal vaccine status: Declined,  Education has been provided regarding the importance of this vaccine but patient still declined. Advised may receive this vaccine at local pharmacy or Health Dept. Aware to provide a copy of the vaccination record if obtained from local pharmacy or Health Dept. Verbalized acceptance and understanding.   Covid-19 vaccine status: Declined, Education has been provided regarding the importance of this vaccine but patient still declined. Advised may receive this vaccine at local pharmacy or Health Dept.or vaccine clinic. Aware to provide a copy of the vaccination record if obtained from local pharmacy or Health Dept. Verbalized acceptance and understanding.  Qualifies for Shingles Vaccine? Yes   Zostavax completed No   Shingrix Completed?: No.    Education has been provided regarding the importance of this vaccine. Patient has been advised to call insurance company to determine out of pocket expense if they have not yet received this vaccine. Advised may also receive vaccine at local pharmacy or Health Dept. Verbalized acceptance and understanding.  Screening Tests Health Maintenance  Topic Date Due   Zoster Vaccines- Shingrix (1 of 2) 11/23/2022 (Originally 09/25/1984)   DTaP/Tdap/Td (2 - Td or Tdap) 01/23/2023   INFLUENZA VACCINE  02/09/2023   Medicare Annual Wellness (AWV)  10/20/2023   COLONOSCOPY (Pts 45-42yrs Insurance coverage will need to be confirmed)  03/01/2026   Hepatitis C Screening  Completed   HIV Screening  Completed   HPV VACCINES  Aged Out   COVID-19 Vaccine  Discontinued    Health Maintenance  There are no preventive care reminders to display for this patient.   Colorectal cancer screening: Type of screening: Colonoscopy. Completed yes. Repeat every 5 years  Lung Cancer Screening: (Low Dose CT Chest recommended if Age 64-80 years, 30 pack-year currently smoking OR have quit w/in 15years.)  does qualify.   Lung Cancer Screening Referral: no pt declines  Additional Screening:  Hepatitis C Screening: does not qualify; Completed yes  Vision Screening: Recommended annual ophthalmology exams for early detection of glaucoma and other disorders of the eye. Is the patient up to date with their annual eye exam?  Yes  Who is the provider or what is the name of the office in which the patient attends annual eye exams? Dr Lynden Oxford If pt is not established with a provider, would they like to be referred to a provider to establish care? No .   Dental Screening: Recommended annual dental exams for proper oral hygiene  Community Resource Referral / Chronic Care Management: CRR required this visit?  No   CCM required this visit?  No      Plan:     I have personally reviewed and noted the following in the patient's chart:   Medical and social history Use of alcohol, tobacco or illicit drugs  Current medications and supplements including opioid prescriptions. Patient is not currently taking opioid prescriptions. Functional ability and status Nutritional status Physical activity Advanced directives List of other physicians Hospitalizations, surgeries, and ER visits in previous 12 months Vitals Screenings to include cognitive, depression, and falls Referrals and appointments  In addition, I have reviewed and discussed with patient certain preventive protocols, quality metrics, and  best practice recommendations. A written personalized care plan for preventive services as well as general preventive health recommendations were provided to patient.    Sue LushBrenda L Heaton Sarin, LPN   9/60/45404/05/2023   Nurse Notes: pt expressed having many challenges with his health. Pt relays he no longer drives due to his vision getting worse after cataract surgery. He states he catches the bus (as he is directly on the bus line) and he also has a transportation card. Pt relays he does not sleep at night due  to many noises and activity with neighbors and people in his apt building. He relays he sleeps in the daytime. *Pt relays he is having very high light bills. Says the is short or problem in building or with electric company and can get no resolve. Pt needs assistance paying for bills. *Pt is very isolated and states he does not mingle/visit with people not people with him much.  *He relays he needs a dental referral: made

## 2022-12-01 DIAGNOSIS — H26492 Other secondary cataract, left eye: Secondary | ICD-10-CM | POA: Diagnosis not present

## 2022-12-25 NOTE — Progress Notes (Deleted)
Established Patient Office Visit  Subjective   Patient ID: Darren Larsen, male    DOB: February 02, 1966  Age: 57 y.o. MRN: [id number]  No chief complaint on file.   04/2022 Not seen since 01/2021 HTN, LBP   s/p THR Left hip 05/2021  This a 57 year old male seen in return follow-up not seen July 2022.  He has had a left total hip replacement but now has low back pain radiating into the right groin.  He states gabapentin had caused side effects.  He now lives in [place] has housing.  On arrival blood pressure 148/89.  He has not been compliant with some of his medications.  He is looking for pain management.  He drinks 1 beer occasionally.  Smokes about 1 cigarette every other day.  08/25/22 Patient seen in return follow-up from October visit patient living in [place] at this time.  He has a claw deformity of the right hand and was seen by orthopedics.  They did not see any surgical intervention.  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  Patient is yet to see neurology.  He is also had left cataract surgery and is recovering from this.  Patient also has hypertension on arrival blood pressure is excellent 116/72.  Also has chronic low back pain which is stable at this time.  He has ongoing tobacco use patient does not drink alcohol.  12/27/22      Patient Active Problem List   Diagnosis Date Noted   Combined forms of age-related cataract of left eye 07/12/2022   Claw hand of right upper extremity 04/21/2022   Chronic pain syndrome 04/21/2022   Status post total replacement of left hip 05/03/2021   Gastroesophageal reflux 01/25/2021   Tobacco dependence 01/25/2021   Chronic bilateral low back pain with left-sided sciatica 11/24/2020   Bilateral hand numbness 10/29/2014   Onychomycosis of toenail 10/29/2014   HTN (hypertension) 10/29/2014   Retinitis pigmentosa    Past Medical History:  Diagnosis Date    Anxiety    Arthritis    hands   Asthma    Cataract    GERD (gastroesophageal reflux disease)    History of concussion    per pt concussion without LOC 1980s -- no residuals   History of seizure    none in 30 years ago- none since then   Hypertension    Retinitis pigmentosa congenital    Seizures (HCC)    Spermatocele    left   Spermatocele of epididymis, unspecified 04/17/2018   Wears glasses    Past Surgical History:  Procedure Laterality Date   COLONOSCOPY     greater than 30 years   INGUINAL HERNIA REPAIR Left infant;  age 61;  57s   SPERMATOCELECTOMY Left 06/26/2018   Procedure: SPERMATOCELECTOMY;  Surgeon: Rene Paci, MD;  Location: [facility];  Service: Urology;  Laterality: Left;   TOTAL HIP ARTHROPLASTY Left 05/03/2021   Procedure: LEFT TOTAL HIP ARTHROPLASTY ANTERIOR APPROACH;  Surgeon: Tarry Kos, MD;  Location: [facility];  Service: Orthopedics;  Laterality: Left;  3-C   Social History   Tobacco Use   Smoking status: Former    Packs/day: 0.50    Years: 20.00    Additional pack years: 0.00    Total pack years: 10.00    Types: Cigarettes    Quit date: 10/10/2022    Years since quitting: 0.2   Smokeless tobacco: Never   Tobacco  comments:    recently stopped smoking approx. 06-14-2018  Vaping Use   Vaping Use: Former  Substance Use Topics   Alcohol use: Not Currently    Comment: pt states he drinks "about a fifth evry week"   Drug use: Not Currently    Types: Marijuana    Comment: twice a week, if that, per pt   Social History   Socioeconomic History   Marital status: Legally Separated    Spouse name: Not on file   Number of children: 5   Years of education: 12    Highest education level: Not on file  Occupational History   Occupation: Carpentry    Occupation: Curator   Tobacco Use   Smoking status: Former    Packs/day: 0.50    Years: 20.00    Additional pack years: 0.00    Total pack years: 10.00    Types: Cigarettes    Quit date:  10/10/2022    Years since quitting: 0.2   Smokeless tobacco: Never   Tobacco comments:    recently stopped smoking approx. 06-14-2018  Vaping Use   Vaping Use: Former  Substance and Sexual Activity   Alcohol use: Not Currently    Comment: pt states he drinks "about a fifth evry week"   Drug use: Not Currently    Types: Marijuana    Comment: twice a week, if that, per pt   Sexual activity: Not on file  Other Topics Concern   Not on file  Social History Narrative   Live with niece   Children grown and gone.   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    Social Determinants of Health   Financial Resource Strain: High Risk (10/20/2022)   Overall Financial Resource Strain (CARDIA)    Difficulty of Paying Living Expenses: Very hard  Food Insecurity: No Food Insecurity (10/20/2022)   Hunger Vital Sign    Worried About Running Out of Food in the Last Year: Never true    Ran Out of Food in the Last Year: Never true  Transportation Needs: No Transportation Needs (10/20/2022)   PRAPARE - Administrator, Civil Service (Medical): No    Lack of Transportation (Non-Medical): No  Physical Activity: Sufficiently Active (10/20/2022)   Exercise Vital Sign    Days of Exercise per Week: 5 days    Minutes of Exercise per Session: 30 min  Stress: No Stress Concern Present (10/20/2022)   [employer] of Occupational Health - Occupational Stress Questionnaire    Feeling of Stress : Only a little  Social Connections: Socially Isolated (10/20/2022)   Social Connection and Isolation Panel [NHANES]    Frequency of Communication with Friends and Family: Once a week    Frequency of Social Gatherings with Friends and Family: Once a week    Attends Religious Services: Never    Database administrator or Organizations: No    Attends Banker Meetings: Never    Marital Status: Separated  Intimate Partner Violence: Not  At Risk (10/20/2022)   Humiliation, Afraid, Rape, and Kick questionnaire    Fear of Current or Ex-Partner: No    Emotionally Abused: No    Physically Abused: No    Sexually Abused: No   Family Status  Relation Name Status   Mother  Deceased   Father  Deceased   Neg Hx  (Not Specified)   Family History  Problem Relation Age of Onset   Hypertension Mother    Colon cancer Neg Hx    Esophageal cancer Neg Hx    Rectal cancer Neg Hx    Stomach cancer Neg Hx    Allergies  Allergen Reactions   Gabapentin Itching    Review of Systems  Constitutional:  Negative for chills, diaphoresis, fever, malaise/fatigue and weight loss.  HENT:  Negative for congestion, hearing loss, nosebleeds, sore throat and tinnitus.   Eyes:  Negative for blurred vision, photophobia and redness.  Respiratory:  Negative for cough, hemoptysis, sputum production, shortness of breath, wheezing and stridor.   Cardiovascular:  Negative for chest pain, palpitations, orthopnea, claudication, leg swelling and PND.  Gastrointestinal:  Negative for abdominal pain, blood in stool, constipation, diarrhea, heartburn, nausea and vomiting.  Genitourinary:  Negative for dysuria, flank pain, frequency, hematuria and urgency.  Musculoskeletal:  Positive for back pain and neck pain. Negative for falls, joint pain and myalgias.  Skin:  Negative for itching and rash.  Neurological:  Negative for dizziness, tingling, tremors, sensory change, speech change, focal weakness, seizures, loss of consciousness, weakness and headaches.  Endo/Heme/Allergies:  Negative for environmental allergies and polydipsia. Does not bruise/bleed easily.  Psychiatric/Behavioral:  Negative for depression, memory loss, substance abuse and suicidal ideas. The patient is not nervous/anxious and does not have insomnia.       Objective:     There were no vitals taken for this visit. BP Readings from Last 3 Encounters:  08/25/22 116/72  04/21/22 (!) 148/89   05/04/21 (!) 138/95   Wt Readings from Last 3 Encounters:  10/20/22 196 lb (88.9 kg)  08/25/22 196 lb 9.6 oz (89.2 kg)  04/21/22 212 lb (96.2 kg)      Physical Exam Vitals reviewed.  Constitutional:      Appearance: Normal appearance. He is well-developed. He is not diaphoretic.  HENT:     Head: Normocephalic and atraumatic.     Nose: No nasal deformity, septal deviation, mucosal edema or rhinorrhea.     Right Sinus: No maxillary sinus tenderness or frontal sinus tenderness.     Left Sinus: No maxillary sinus tenderness or frontal sinus tenderness.     Mouth/Throat:     Pharynx: No oropharyngeal exudate.  Eyes:     General: No scleral icterus.    Conjunctiva/sclera: Conjunctivae normal.     Pupils: Pupils are equal, round, and reactive to light.  Neck:     Thyroid: No thyromegaly.     Vascular: No carotid bruit or JVD.     Trachea: Trachea normal. No tracheal tenderness or tracheal deviation.  Cardiovascular:     Rate and Rhythm: Normal rate and regular rhythm.     Chest Wall: PMI is not displaced.     Pulses: Normal pulses. No decreased pulses.     Heart sounds: Normal heart sounds, S1 normal and S2 normal. Heart sounds not distant. No murmur heard.    No systolic murmur is present.     No diastolic murmur is present.     No friction rub. No gallop. No S3 or S4 sounds.  Pulmonary:     Effort: No tachypnea, accessory muscle usage or respiratory distress.     Breath sounds: No stridor. No decreased breath sounds, wheezing, rhonchi or rales.  Chest:     Chest wall: No tenderness.  Abdominal:     General: Bowel sounds are normal. There is no distension.     Palpations: Abdomen is soft. Abdomen is not  rigid.     Tenderness: There is no abdominal tenderness. There is no guarding or rebound.  Genitourinary:    Penis: Normal.      Testes: Normal.  Musculoskeletal:        General: Tenderness and deformity present. Normal range of motion.     Cervical back: Normal range of  motion and neck supple. No edema, erythema or rigidity. No muscular tenderness. Normal range of motion.     Comments: Clawhand deformity right hand Bilateral lumbar paraspinal tenderness  Lymphadenopathy:     Head:     Right side of head: No submental or submandibular adenopathy.     Left side of head: No submental or submandibular adenopathy.     Cervical: No cervical adenopathy.  Skin:    General: Skin is warm and dry.     Coloration: Skin is not pale.     Findings: No rash.     Nails: There is no clubbing.  Neurological:     Mental Status: He is alert and oriented to person, place, and time.     Sensory: No sensory deficit.  Psychiatric:        Speech: Speech normal.        Behavior: Behavior normal.      No results found for any visits on 12/27/22.   Last CBC Lab Results  Component Value Date   WBC 8.5 04/21/2022   HGB 12.9 (L) 04/21/2022   HCT 41.1 04/21/2022   MCV 74 (L) 04/21/2022   MCH 23.3 (L) 04/21/2022   RDW 16.8 (H) 04/21/2022   PLT 192 04/21/2022   Last metabolic panel Lab Results  Component Value Date   GLUCOSE 94 04/21/2022   NA 143 04/21/2022   K 4.5 04/21/2022   CL 106 04/21/2022   CO2 22 04/21/2022   BUN 14 04/21/2022   CREATININE 1.03 04/21/2022   GFRNONAA >60 05/04/2021   CALCIUM 10.0 04/21/2022   PROT 6.8 04/21/2022   ALBUMIN 4.4 04/21/2022   LABGLOB 2.4 04/21/2022   AGRATIO 1.8 04/21/2022   BILITOT 0.2 04/21/2022   ALKPHOS 108 04/21/2022   AST 17 04/21/2022   ALT 13 04/21/2022   ANIONGAP 7 05/04/2021   Last lipids Lab Results  Component Value Date   CHOL 130 04/21/2022   HDL 47 04/21/2022   LDLCALC 68 04/21/2022   TRIG 72 04/21/2022   CHOLHDL 2.8 04/21/2022      The 10-year ASCVD risk score (Arnett DK, et al., 2019) is: 17.6%    Assessment & Plan:   Problem List Items Addressed This Visit   None 38 minutes spent extra time needed for patient education and review of multiple systems multiple orders high complexity  No  follow-ups on file.    Shan Levans, MD

## 2022-12-27 ENCOUNTER — Ambulatory Visit: Payer: Medicare (Managed Care) | Admitting: Critical Care Medicine

## 2022-12-30 DIAGNOSIS — H353122 Nonexudative age-related macular degeneration, left eye, intermediate dry stage: Secondary | ICD-10-CM | POA: Diagnosis not present

## 2023-07-01 IMAGING — CR DG LUMBAR SPINE COMPLETE 4+V
5 series · 5 of 5 positions shown · non-contrast
Comparison: None.

CLINICAL DATA: Low back pain and left hip pain after scrotal
surgery June 2018.

EXAM:
LUMBAR SPINE - COMPLETE 4+ VIEW

[l-spine ap]
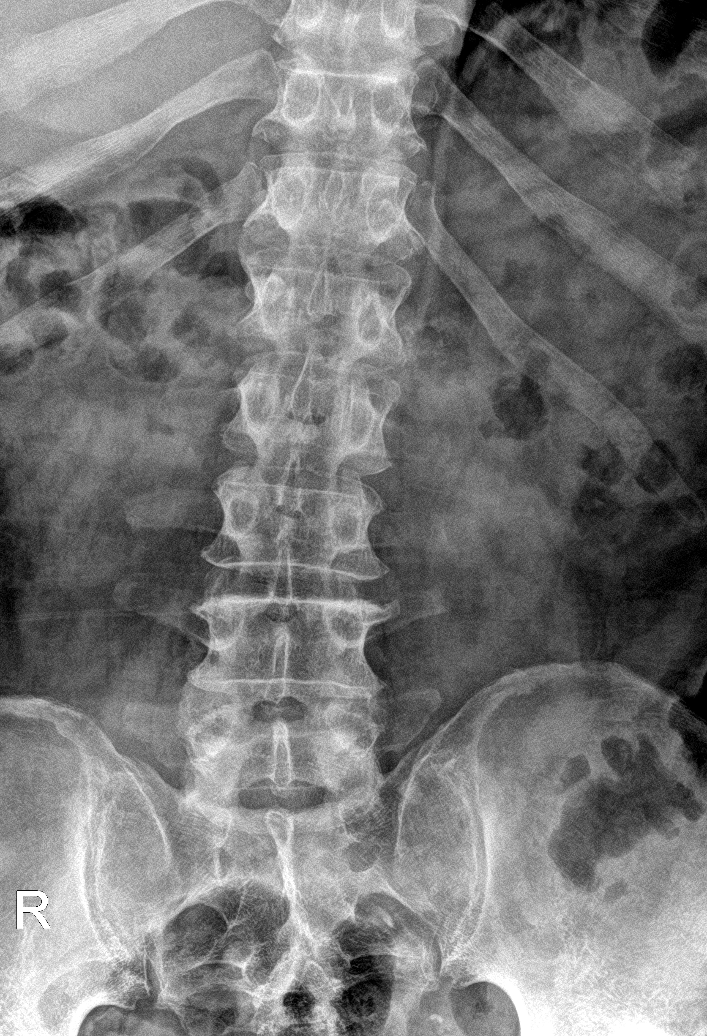

[l-spine obl (1 of 2)]
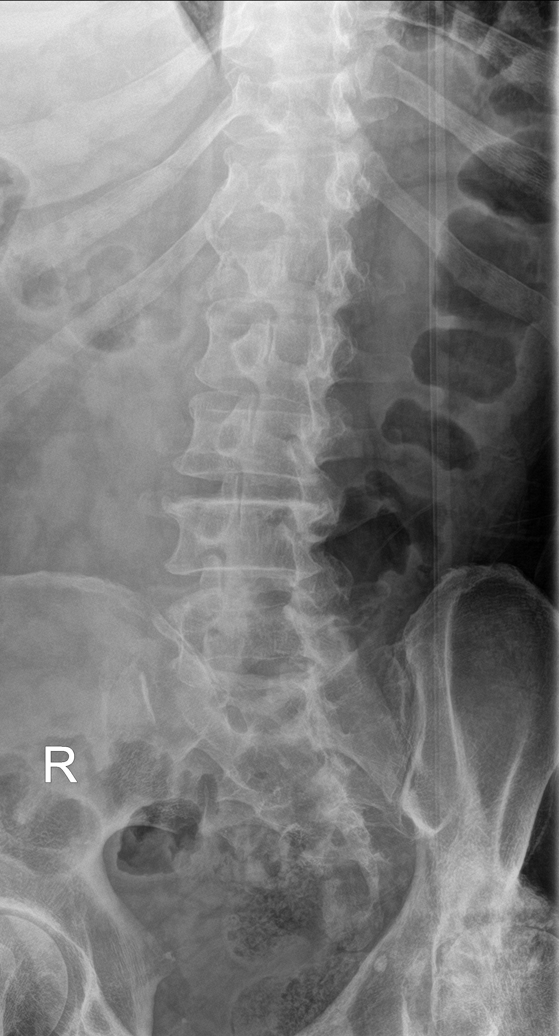

[l-spine obl (2 of 2)]
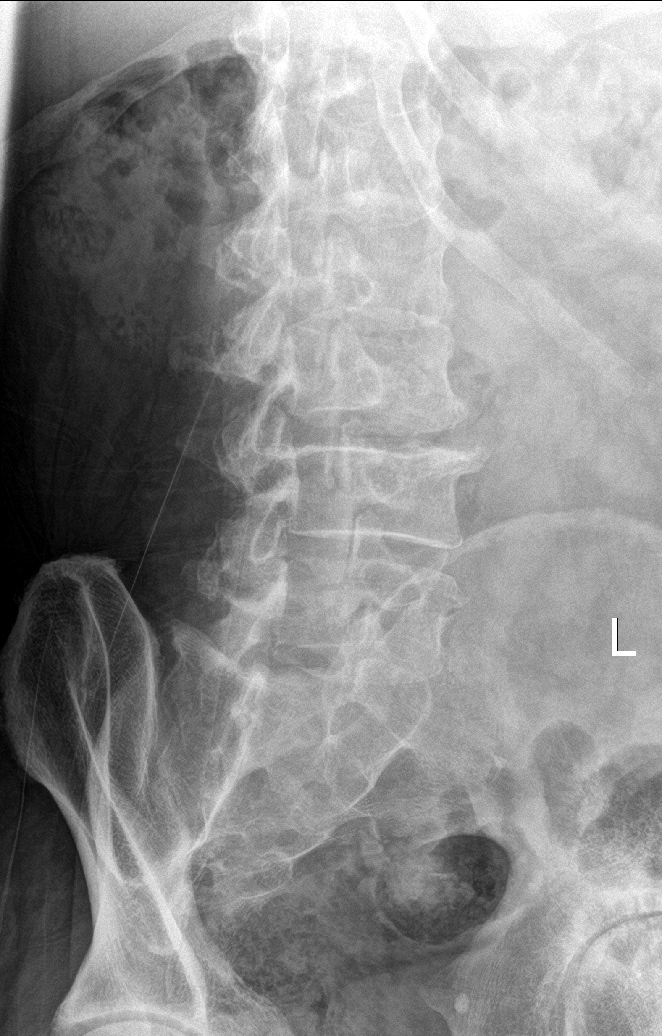

[l-spine lat]
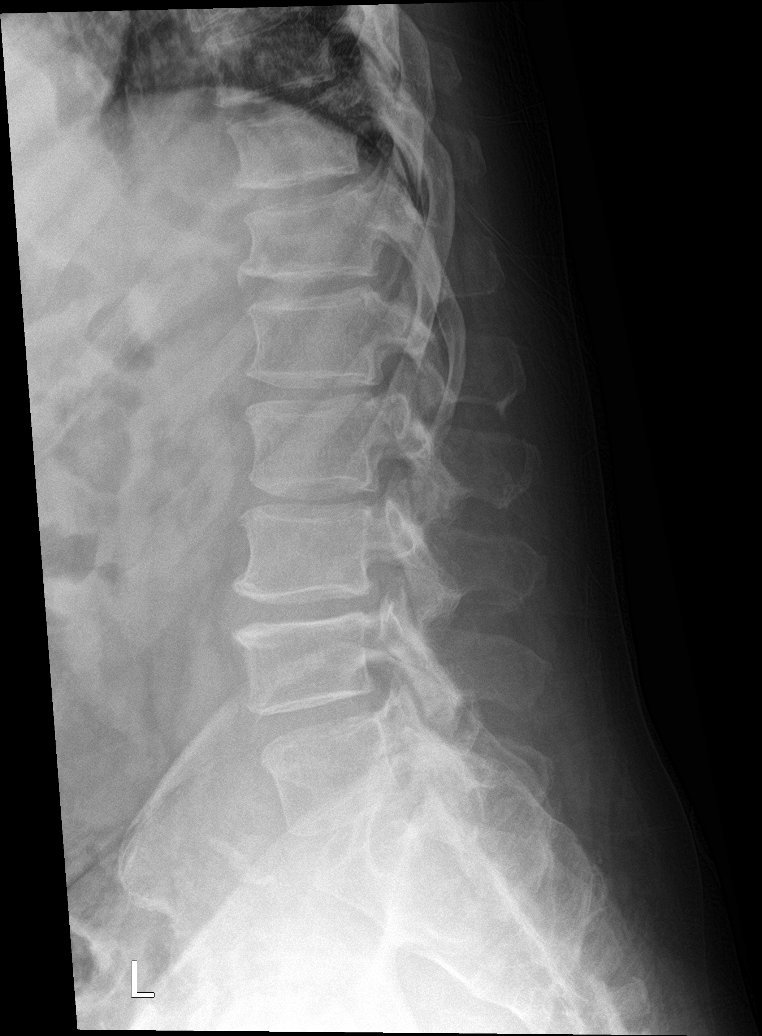

[l-spine spot]
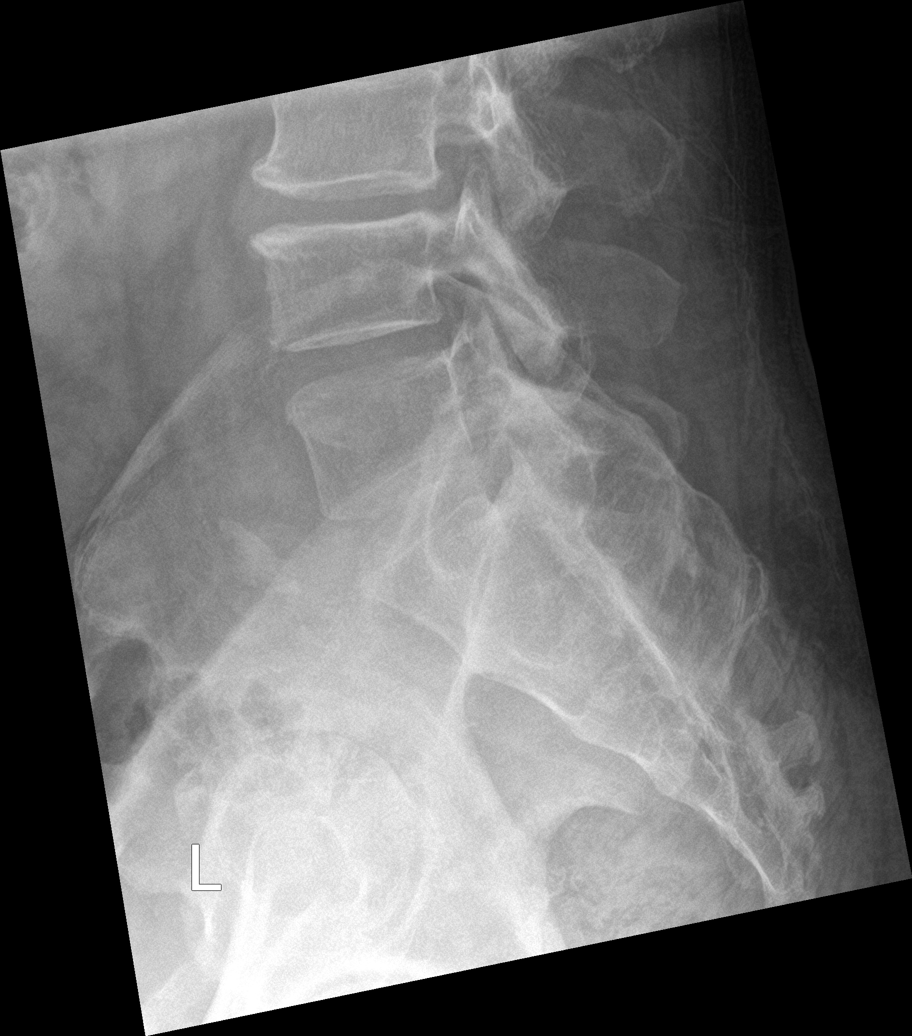

[5 of 5 positions shown; findings below may reference images not displayed]

FINDINGS: Vertebral body alignment is normal. Subtle loss of height of L4
likely chronic, although age indeterminate. Mild anterior wedging of
T11 and T12 likely chronic. There is mild spondylosis throughout the
lumbar spine to include facet arthropathy over the lower lumbar
spine. Mild disc space narrowing at the L3-4 level. No evidence of
spondylolisthesis or spondylolysis.
IMPRESSION: 1. No acute findings.
2. Mild spondylosis of the lumbar spine with mild disc disease at
the L3-4 level.
3. Mild anterior wedging of T11 and T12 likely chronic. Subtle loss
of height of L3 likely chronic, although age indeterminate.

## 2023-09-19 ENCOUNTER — Ambulatory Visit: Payer: Medicare (Managed Care) | Attending: Critical Care Medicine
